# Patient Record
Sex: Female | Born: 1935 | Race: White | Hispanic: No | Marital: Married | Smoking: Never smoker
Health system: Southern US, Community
[De-identification: ages and names within clinical notes are randomized; demographics above are authoritative.]

## PROBLEM LIST (undated history)

## (undated) DIAGNOSIS — R112 Nausea with vomiting, unspecified: Secondary | ICD-10-CM

## (undated) DIAGNOSIS — I10 Essential (primary) hypertension: Secondary | ICD-10-CM

## (undated) DIAGNOSIS — Z9889 Other specified postprocedural states: Secondary | ICD-10-CM

## (undated) DIAGNOSIS — I350 Nonrheumatic aortic (valve) stenosis: Secondary | ICD-10-CM

## (undated) DIAGNOSIS — M1990 Unspecified osteoarthritis, unspecified site: Secondary | ICD-10-CM

## (undated) HISTORY — PX: COLONOSCOPY: SHX174

## (undated) HISTORY — PX: HAND SURGERY: SHX662

## (undated) HISTORY — PX: CARDIAC VALVE REPLACEMENT: SHX585

---

## 1999-03-14 ENCOUNTER — Encounter: Admission: RE | Admit: 1999-03-14 | Discharge: 1999-03-14 | Payer: Self-pay | Admitting: Nephrology

## 1999-03-14 ENCOUNTER — Encounter: Payer: Self-pay | Admitting: Nephrology

## 2000-03-16 ENCOUNTER — Encounter: Admission: RE | Admit: 2000-03-16 | Discharge: 2000-03-16 | Payer: Self-pay | Admitting: Nephrology

## 2000-03-16 ENCOUNTER — Encounter: Payer: Self-pay | Admitting: Nephrology

## 2001-03-17 ENCOUNTER — Encounter: Admission: RE | Admit: 2001-03-17 | Discharge: 2001-03-17 | Payer: Self-pay | Admitting: Nephrology

## 2001-03-17 ENCOUNTER — Encounter: Payer: Self-pay | Admitting: Nephrology

## 2002-02-17 DIAGNOSIS — I350 Nonrheumatic aortic (valve) stenosis: Secondary | ICD-10-CM

## 2002-02-17 HISTORY — DX: Nonrheumatic aortic (valve) stenosis: I35.0

## 2002-02-17 HISTORY — PX: CARDIAC CATHETERIZATION: SHX172

## 2002-03-18 ENCOUNTER — Encounter: Payer: Self-pay | Admitting: Nephrology

## 2002-03-18 ENCOUNTER — Encounter: Admission: RE | Admit: 2002-03-18 | Discharge: 2002-03-18 | Payer: Self-pay | Admitting: Nephrology

## 2002-07-18 ENCOUNTER — Encounter: Admission: RE | Admit: 2002-07-18 | Discharge: 2002-07-18 | Payer: Self-pay | Admitting: Nephrology

## 2002-07-18 ENCOUNTER — Encounter: Payer: Self-pay | Admitting: Nephrology

## 2002-08-24 ENCOUNTER — Encounter (INDEPENDENT_AMBULATORY_CARE_PROVIDER_SITE_OTHER): Payer: Self-pay | Admitting: Cardiology

## 2002-08-24 ENCOUNTER — Ambulatory Visit: Admission: RE | Admit: 2002-08-24 | Discharge: 2002-08-24 | Payer: Self-pay | Admitting: Cardiology

## 2002-08-30 ENCOUNTER — Encounter: Payer: Self-pay | Admitting: Cardiology

## 2002-08-30 ENCOUNTER — Inpatient Hospital Stay (HOSPITAL_COMMUNITY): Admission: RE | Admit: 2002-08-30 | Discharge: 2002-09-09 | Payer: Self-pay | Admitting: Cardiology

## 2002-09-02 ENCOUNTER — Encounter: Payer: Self-pay | Admitting: Thoracic Surgery (Cardiothoracic Vascular Surgery)

## 2002-09-02 ENCOUNTER — Encounter (INDEPENDENT_AMBULATORY_CARE_PROVIDER_SITE_OTHER): Payer: Self-pay

## 2002-09-03 ENCOUNTER — Encounter: Payer: Self-pay | Admitting: Thoracic Surgery (Cardiothoracic Vascular Surgery)

## 2002-09-04 ENCOUNTER — Encounter: Payer: Self-pay | Admitting: Thoracic Surgery (Cardiothoracic Vascular Surgery)

## 2002-09-06 ENCOUNTER — Encounter: Payer: Self-pay | Admitting: Thoracic Surgery (Cardiothoracic Vascular Surgery)

## 2002-09-26 ENCOUNTER — Encounter: Payer: Self-pay | Admitting: Thoracic Surgery (Cardiothoracic Vascular Surgery)

## 2002-09-26 ENCOUNTER — Encounter
Admission: RE | Admit: 2002-09-26 | Discharge: 2002-09-26 | Payer: Self-pay | Admitting: Thoracic Surgery (Cardiothoracic Vascular Surgery)

## 2002-11-07 ENCOUNTER — Encounter (HOSPITAL_COMMUNITY): Admission: RE | Admit: 2002-11-07 | Discharge: 2003-02-05 | Payer: Self-pay | Admitting: Cardiology

## 2003-04-20 ENCOUNTER — Encounter: Admission: RE | Admit: 2003-04-20 | Discharge: 2003-04-20 | Payer: Self-pay | Admitting: Nephrology

## 2003-05-05 ENCOUNTER — Emergency Department (HOSPITAL_COMMUNITY): Admission: EM | Admit: 2003-05-05 | Discharge: 2003-05-05 | Payer: Self-pay

## 2003-11-17 ENCOUNTER — Encounter (HOSPITAL_COMMUNITY): Admission: RE | Admit: 2003-11-17 | Discharge: 2003-11-21 | Payer: Self-pay | Admitting: Nephrology

## 2004-04-24 ENCOUNTER — Encounter: Admission: RE | Admit: 2004-04-24 | Discharge: 2004-04-24 | Payer: Self-pay | Admitting: Nephrology

## 2004-05-28 ENCOUNTER — Encounter: Admission: RE | Admit: 2004-05-28 | Discharge: 2004-05-28 | Payer: Self-pay | Admitting: Nephrology

## 2004-09-26 ENCOUNTER — Encounter: Admission: RE | Admit: 2004-09-26 | Discharge: 2004-09-26 | Payer: Self-pay | Admitting: Nephrology

## 2004-09-27 ENCOUNTER — Ambulatory Visit (HOSPITAL_COMMUNITY): Admission: RE | Admit: 2004-09-27 | Discharge: 2004-09-27 | Payer: Self-pay | Admitting: Nephrology

## 2004-10-22 ENCOUNTER — Encounter: Admission: RE | Admit: 2004-10-22 | Discharge: 2004-10-22 | Payer: Self-pay | Admitting: Nephrology

## 2004-12-27 ENCOUNTER — Emergency Department: Payer: Self-pay | Admitting: Emergency Medicine

## 2005-01-03 ENCOUNTER — Ambulatory Visit (HOSPITAL_COMMUNITY): Admission: RE | Admit: 2005-01-03 | Discharge: 2005-01-03 | Payer: Self-pay | Admitting: Cardiology

## 2005-01-29 ENCOUNTER — Encounter: Admission: RE | Admit: 2005-01-29 | Discharge: 2005-01-29 | Payer: Self-pay | Admitting: Orthopedic Surgery

## 2005-03-21 ENCOUNTER — Encounter: Admission: RE | Admit: 2005-03-21 | Discharge: 2005-03-21 | Payer: Self-pay | Admitting: Orthopedic Surgery

## 2005-05-13 ENCOUNTER — Encounter: Admission: RE | Admit: 2005-05-13 | Discharge: 2005-05-13 | Payer: Self-pay | Admitting: Nephrology

## 2005-10-10 ENCOUNTER — Other Ambulatory Visit: Admission: RE | Admit: 2005-10-10 | Discharge: 2005-10-10 | Payer: Self-pay | Admitting: Nephrology

## 2005-10-10 ENCOUNTER — Encounter: Admission: RE | Admit: 2005-10-10 | Discharge: 2005-10-10 | Payer: Self-pay | Admitting: Nephrology

## 2005-10-30 ENCOUNTER — Encounter: Admission: RE | Admit: 2005-10-30 | Discharge: 2005-10-30 | Payer: Self-pay | Admitting: Nephrology

## 2006-01-16 ENCOUNTER — Encounter: Admission: RE | Admit: 2006-01-16 | Discharge: 2006-01-16 | Payer: Self-pay | Admitting: Orthopedic Surgery

## 2006-04-21 ENCOUNTER — Encounter: Admission: RE | Admit: 2006-04-21 | Discharge: 2006-04-21 | Payer: Self-pay | Admitting: Orthopedic Surgery

## 2006-05-19 ENCOUNTER — Encounter: Admission: RE | Admit: 2006-05-19 | Discharge: 2006-05-19 | Payer: Self-pay | Admitting: Nephrology

## 2006-06-27 ENCOUNTER — Emergency Department: Payer: Self-pay | Admitting: Unknown Physician Specialty

## 2006-09-26 ENCOUNTER — Inpatient Hospital Stay (HOSPITAL_COMMUNITY): Admission: RE | Admit: 2006-09-26 | Discharge: 2006-09-27 | Payer: Self-pay | Admitting: Orthopedic Surgery

## 2006-11-05 ENCOUNTER — Encounter: Admission: RE | Admit: 2006-11-05 | Discharge: 2006-11-05 | Payer: Self-pay | Admitting: Orthopedic Surgery

## 2006-11-26 ENCOUNTER — Encounter: Admission: RE | Admit: 2006-11-26 | Discharge: 2007-02-24 | Payer: Self-pay | Admitting: Orthopedic Surgery

## 2007-01-26 ENCOUNTER — Encounter: Admission: RE | Admit: 2007-01-26 | Discharge: 2007-01-26 | Payer: Self-pay | Admitting: Nephrology

## 2007-03-08 ENCOUNTER — Inpatient Hospital Stay (HOSPITAL_COMMUNITY): Admission: AD | Admit: 2007-03-08 | Discharge: 2007-03-18 | Payer: Self-pay | Admitting: Gastroenterology

## 2007-03-11 ENCOUNTER — Encounter (INDEPENDENT_AMBULATORY_CARE_PROVIDER_SITE_OTHER): Payer: Self-pay | Admitting: Gastroenterology

## 2007-03-22 ENCOUNTER — Ambulatory Visit: Payer: Self-pay | Admitting: Gastroenterology

## 2007-04-27 ENCOUNTER — Encounter: Admission: RE | Admit: 2007-04-27 | Discharge: 2007-04-27 | Payer: Self-pay | Admitting: Gastroenterology

## 2007-05-20 ENCOUNTER — Encounter: Admission: RE | Admit: 2007-05-20 | Discharge: 2007-05-20 | Payer: Self-pay | Admitting: Nephrology

## 2007-06-07 ENCOUNTER — Encounter: Admission: RE | Admit: 2007-06-07 | Discharge: 2007-06-07 | Payer: Self-pay | Admitting: Cardiology

## 2007-11-03 ENCOUNTER — Ambulatory Visit (HOSPITAL_COMMUNITY): Admission: RE | Admit: 2007-11-03 | Discharge: 2007-11-03 | Payer: Self-pay | Admitting: Cardiology

## 2008-03-06 ENCOUNTER — Emergency Department (HOSPITAL_COMMUNITY): Admission: EM | Admit: 2008-03-06 | Discharge: 2008-03-06 | Payer: Self-pay | Admitting: *Deleted

## 2008-05-31 ENCOUNTER — Encounter: Admission: RE | Admit: 2008-05-31 | Discharge: 2008-05-31 | Payer: Self-pay | Admitting: Nephrology

## 2009-03-29 IMAGING — CR DG CHEST 2V
2 series · 2 of 2 positions shown · non-contrast
Comparison: 09/26/2002.

CLINICAL DATA: Arthritis right, preop. Hypertension.

CHEST - 2 VIEW:

[view not recorded (1 of 2)]
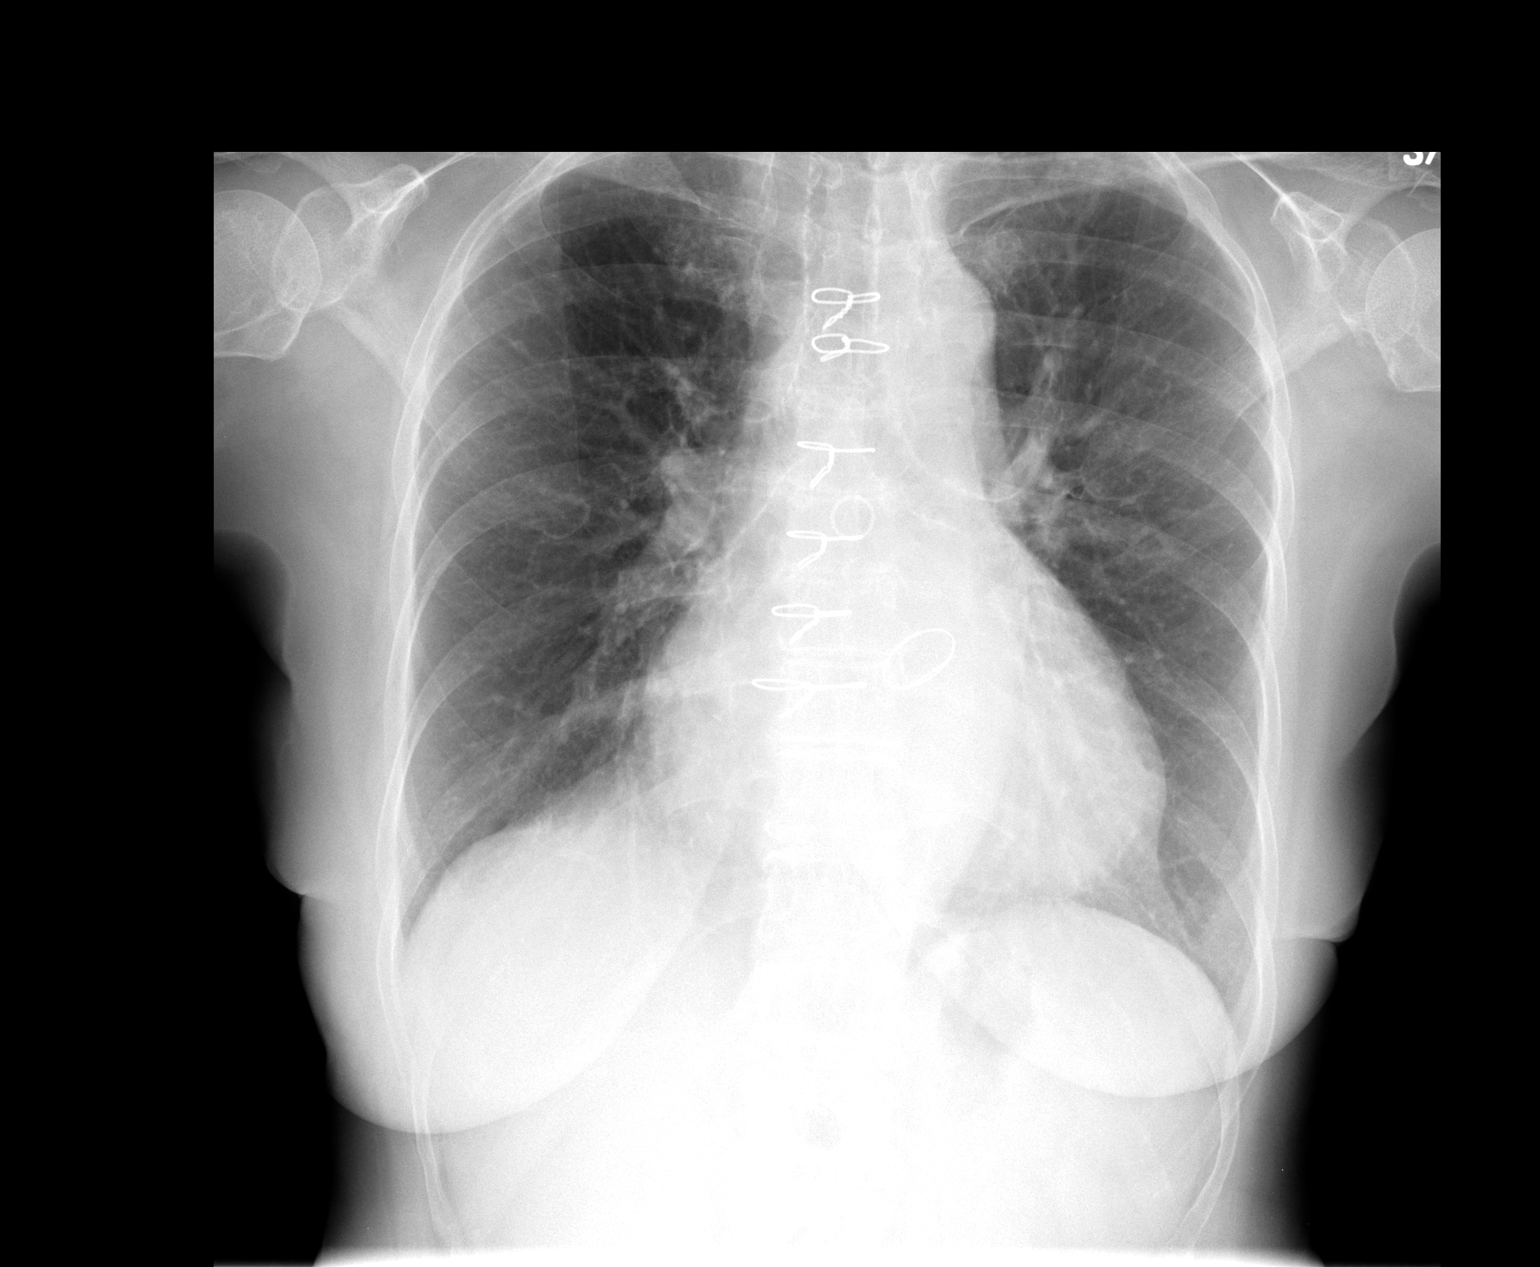

[view not recorded (2 of 2)]
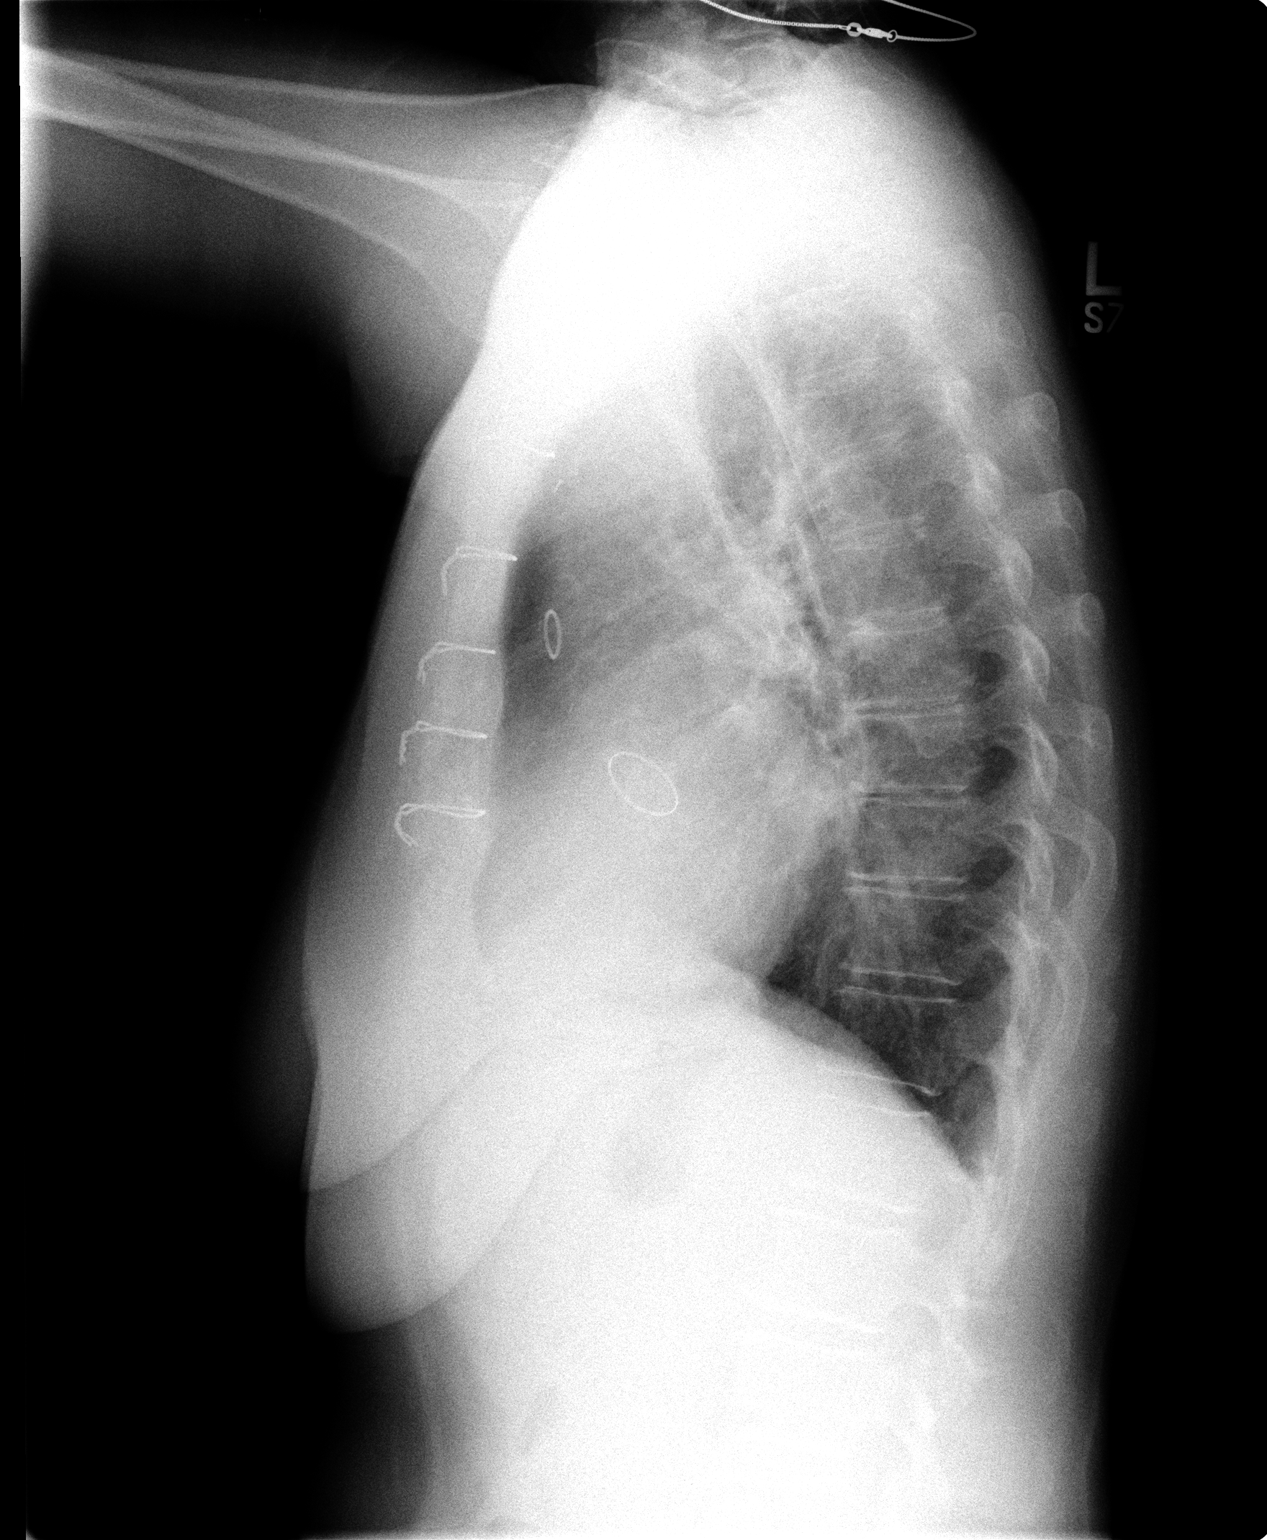

[2 of 2 positions shown; findings below may reference images not displayed]

FINDINGS: Patient status post CABG and aortic valve replacement. There is
cardiomegaly. No focal air space opacities or effusions.
IMPRESSION: Cardiomegaly. No acute findings.

## 2009-06-14 ENCOUNTER — Encounter: Admission: RE | Admit: 2009-06-14 | Discharge: 2009-06-14 | Payer: Self-pay | Admitting: Nephrology

## 2009-09-17 IMAGING — CR DG ABDOMEN 2V
2 series · 2 of 2 positions shown · non-contrast
Comparison: none

CLINICAL DATA: 71-year-old female, status post colonoscopy on 03/11/07.  The patient has had nausea and vomiting and blood in stool.  Rule out ileus.
 ABDOMEN ? 2 VIEW:

[w abdomen upright]
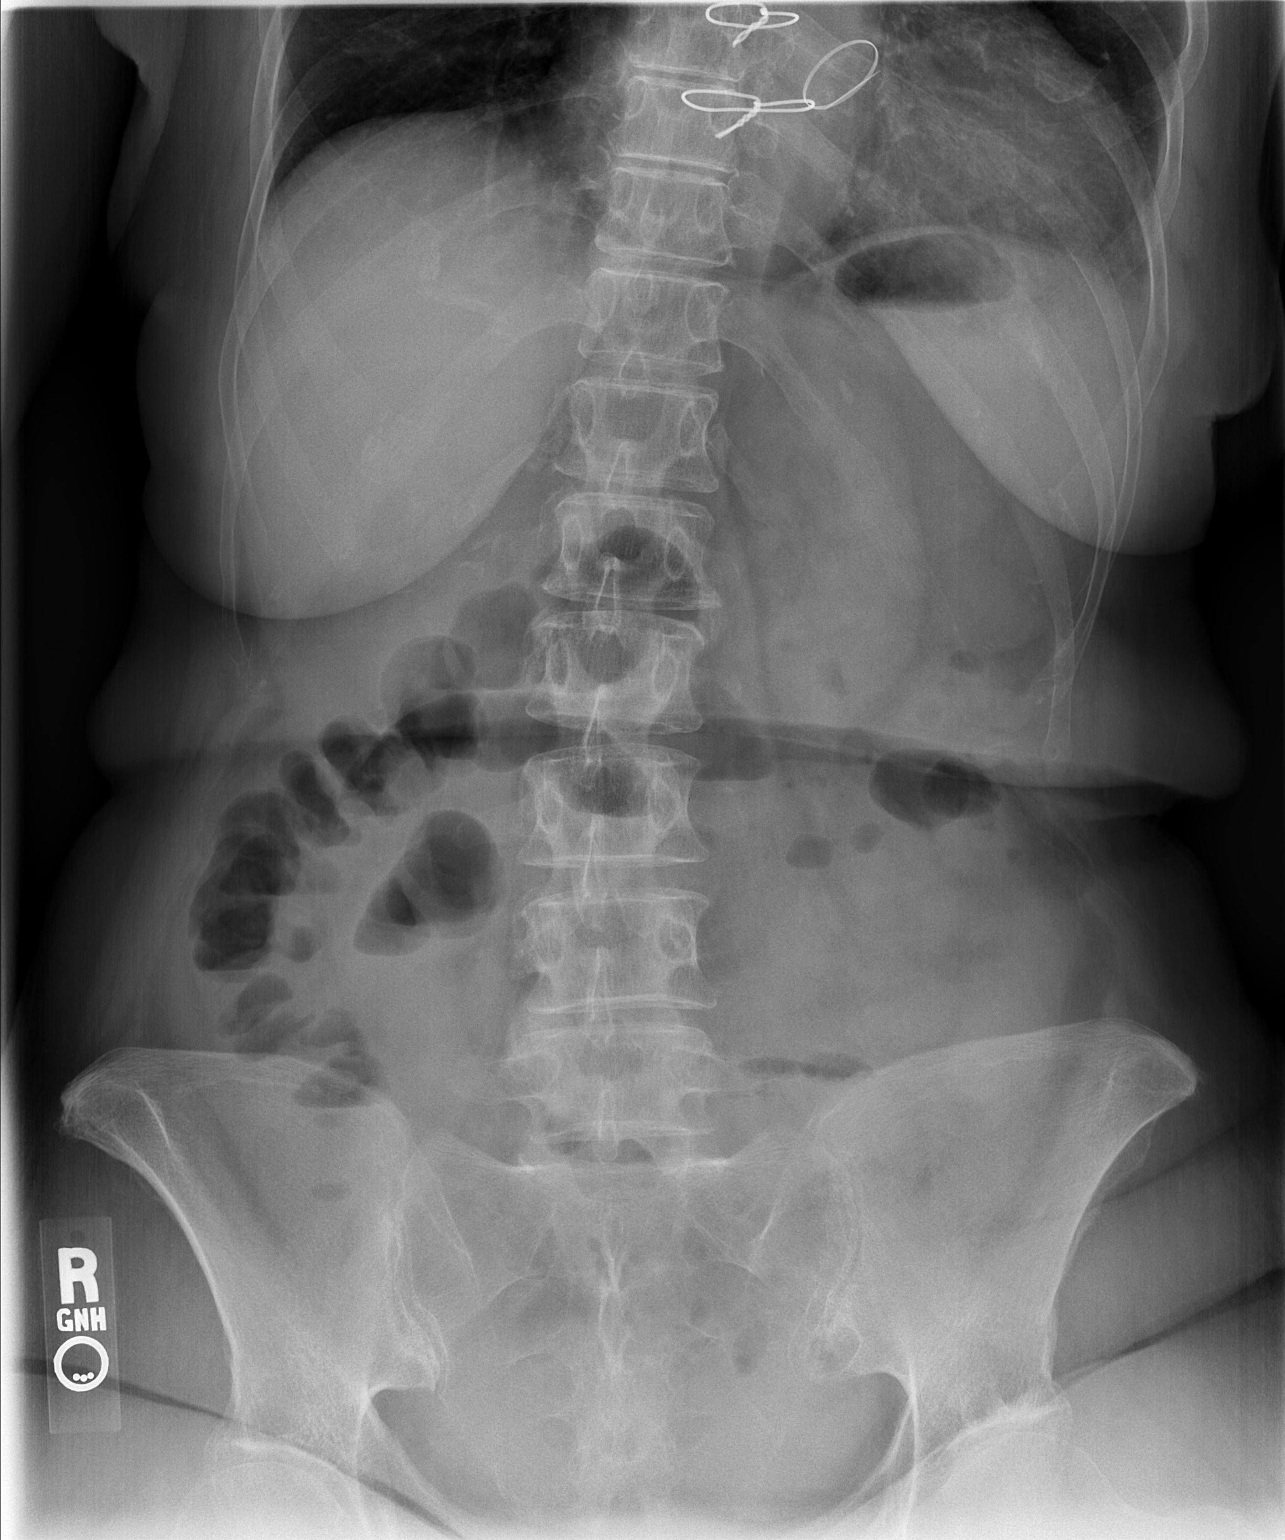

[t abdomen supine]
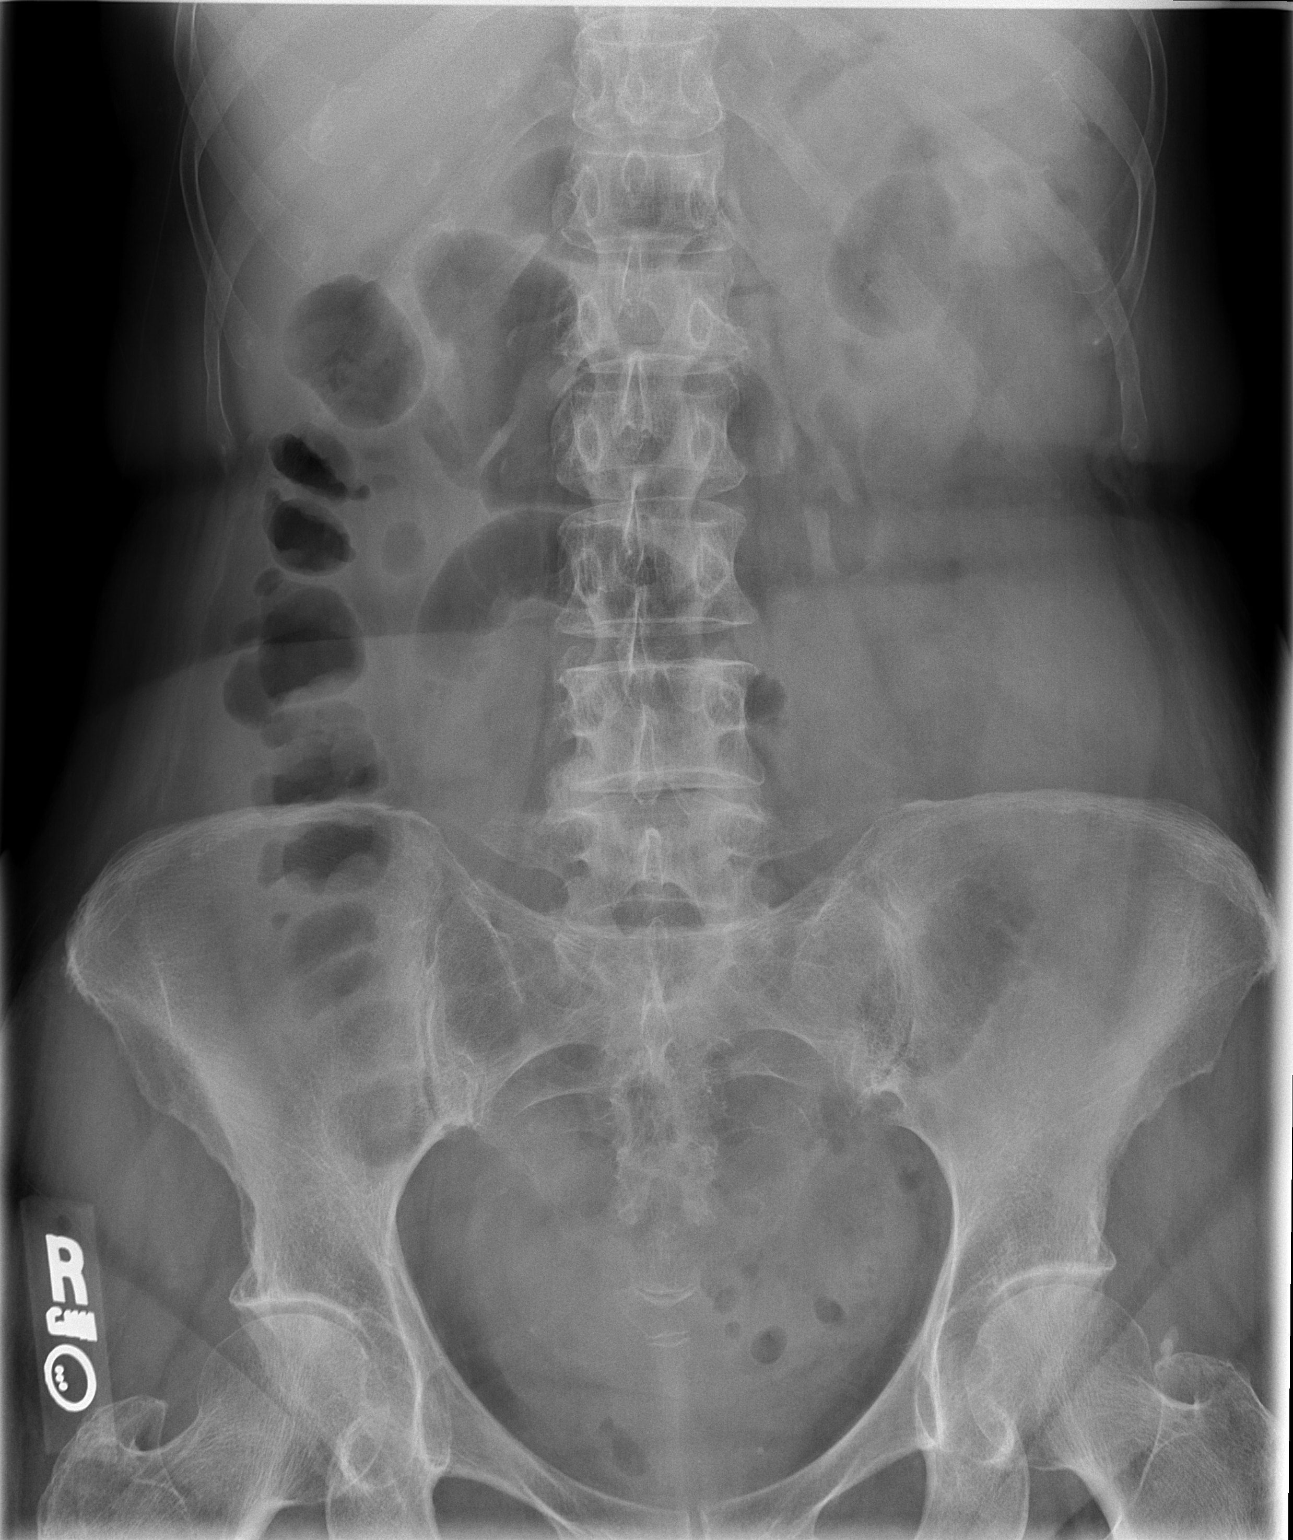

[2 of 2 positions shown; findings below may reference images not displayed]

FINDINGS: Air is seen within the ascending proximal transverse colon.  Air fluid levels are present within nondilated loops of small bowel compatible with ileus.  There is no evidence for free air.
IMPRESSION: 1. Nondilated loops of small bowel demonstrate air fluid levels compatible with an ileus.
 2. Air can be seen to at least the mid transverse colon.

## 2009-10-02 ENCOUNTER — Encounter: Admission: RE | Admit: 2009-10-02 | Discharge: 2009-10-02 | Payer: Self-pay | Admitting: Nephrology

## 2009-10-10 ENCOUNTER — Encounter: Admission: RE | Admit: 2009-10-10 | Discharge: 2009-10-10 | Payer: Self-pay | Admitting: Orthopedic Surgery

## 2010-03-11 ENCOUNTER — Emergency Department (HOSPITAL_COMMUNITY)
Admission: EM | Admit: 2010-03-11 | Discharge: 2010-03-11 | Payer: Self-pay | Source: Home / Self Care | Admitting: Emergency Medicine

## 2010-03-12 LAB — PROTIME-INR
INR: 3.05 — ABNORMAL HIGH (ref 0.00–1.49)
Prothrombin Time: 31.6 s — ABNORMAL HIGH (ref 11.6–15.2)

## 2010-05-07 ENCOUNTER — Other Ambulatory Visit: Payer: Self-pay | Admitting: Nephrology

## 2010-05-07 DIAGNOSIS — Z1231 Encounter for screening mammogram for malignant neoplasm of breast: Secondary | ICD-10-CM

## 2010-06-17 ENCOUNTER — Ambulatory Visit
Admission: RE | Admit: 2010-06-17 | Discharge: 2010-06-17 | Disposition: A | Discharge status: Home / Self Care | Payer: Medicare Other | Source: Ambulatory Visit | Attending: Nephrology | Admitting: Nephrology

## 2010-06-17 DIAGNOSIS — Z1231 Encounter for screening mammogram for malignant neoplasm of breast: Secondary | ICD-10-CM

## 2010-07-02 NOTE — H&P (Signed)
NAME:  Monica Wolf, Monica Wolf           ACCOUNT NO.:  [id number]   MEDICAL RECORD NO.:  [id number]          PATIENT TYPE:  INP   LOCATION:  3033                         FACILITY:  [facility]   PHYSICIAN:  Jordan Hawks. Elnoria Howard, MD    DATE OF BIRTH:  February 04, 1936   DATE OF ADMISSION:  03/08/2007  DATE OF DISCHARGE:                              HISTORY & PHYSICAL   PRIMARY CARE Dontrez Pettis:  Jarome Matin, M.D.   HISTORY OF PRESENT ILLNESS:  This is a 75 year old female who was  admitted to the hospital for heparin/Coumadin crossover in light of her  aortic valve.  The patient was given the option to use subcutaneous  heparin at home, however, she felt uncomfortable dispatching this  medication to herself and was subsequently was admitted in order to be  on the heparin/Coumadin crossover.  She currently denies any issues with  her colon at this time in regards to diarrhea, constipation,  hematochezia or melena.   PAST SURGICAL HISTORY:  Aortic valve replacement with chronic Coumadin.   PAST MEDICAL HISTORY:  1. Hematuria.  2. Arthritis.  3. Scarlet fever.  4. Hyperlipidemia.  5. Hypertension.   ALLERGIES:  NO KNOWN DRUG ALLERGIES.   MEDICATIONS:  Aspirin, Zocor, Zestril, Requip, Coreg and Coumadin.   FAMILY HISTORY:  Significant for her father dying at the age of 88  secondary to lung cancer.  Mother dying at age of 67 with possible heart  disease.  The patient is married without any children.  No alcohol,  tobacco or illicit drug use.   REVIEW OF SYSTEMS:  Negative per the 11-point review of systems.   PHYSICAL EXAMINATION:  VITAL SIGNS:  Blood pressure is 163/79, heart  rate 55, respirations 20, temperature is 97.5.  GENERAL:  The patient is in no acute distress, alert and oriented.  HEENT:  Normocephalic, atraumatic.  Extraocular muscles intact.  NECK:  Supple.  No lymphadenopathy.  LUNGS:  Clear to auscultation bilaterally.  CARDIOVASCULAR:  Regular rate and rhythm with a 2/6  systolic ejection  murmur.  ABDOMEN:  Flat, soft, nontender, nondistended.  EXTREMITIES:  No clubbing, cyanosis or edema.   LABORATORY DATA AND X-RAY FINDINGS:  Her INR is at 2.8 with a PT of  30.6.   IMPRESSION/PLAN:  1. Aortic valve.  2. Screening colonoscopy.  3. Anorexia.  After evaluating the patient, she requires      heparin/Coumadin crossover.  Her current INR is at 2.8 and I      discontinued her Coumadin for the time being and started her on      Lovenox at 1 mg/kg q.12 h.  Once her INR approaches that of 1.5, I      will ask pharmacy to consult in regards for intravenous heparin.      Pending the results of her INR, she will be prepped for a procedure      and subsequently, she may be able to be discharged if she feels      more comfortable with providing herself with subcutaneous      injections.      Jordan Hawks Elnoria Howard, MD  Electronically  Signed     PDH/MEDQ  D:  03/08/2007  T:  03/09/2007  Job:  [id number]   cc:   Jarome Matin, M.D.  Eduardo Osier. Sharyn Lull, M.D.

## 2010-07-02 NOTE — Op Note (Signed)
NAME:  Monica, Wolf           ACCOUNT NO.:  [id number]   MEDICAL RECORD NO.:  [id number]          PATIENT TYPE:  OIB   LOCATION:  1608                         FACILITY:  [facility]   PHYSICIAN:  Myrtie Neither, MD      DATE OF BIRTH:  08/24/1935   DATE OF PROCEDURE:  DATE OF DISCHARGE:                               OPERATIVE REPORT   PREOPERATIVE DIAGNOSIS:  Degenerative arthropathy, right carpometacarpal  thumb.   POSTOPERATIVE DIAGNOSIS:  Degenerative arthropathy, right  carpometacarpal thumb.   ANESTHESIA:  General.   PROCEDURE:  Fusion carpal metacarpal joint of the right thumb.   DESCRIPTION OF PROCEDURE:  The patient taken to the operating room after  given adequate preop medications given general anesthesia intubated,  right arm was prepped with DuraPrep and draped in sterile manner.  Tourniquet and Bovie used for hemostasis.  Mini C-arm was used visualize  fusion placement.  Dorsal incision made over the right thumb at the  carpal metacarpal joint going through skin and subcutaneous tissue.  Sharp blunt dissection made down to the carpometacarpal joint.  Osteophyte over the base of the first metacarpal was resected.  Capsule  was resected. Articular surface was exposed of the joint.  With a dental  oscillating saw articular surface was removed from the proximal end of  the first metacarpal and over articular surface of the carpal bone.  Thick fibrotic arthritic synovial lining was also resected from the  area.  The thumb was placed into full flexion position function and  stabilized with a K-wire. This was visualized with the mini C-arm and  was found to be in good position, good contact.  A mini plate was used  with three screws, two in the carpal and one in metacarpal to stabilize  the fusion site.  Copious irrigation then done.  Wound closure was then  done with 2-0 Vicryl for the subcutaneous and skin was closed with 4-0  nylon.  Thumb spica cast was applied.  The  patient tolerated procedure  quite well and went to recovery room in stable and satisfactory  condition.      Myrtie Neither, MD  Electronically Signed     AC/MEDQ  D:  09/26/2006  T:  09/28/2006  Job:  [id number]

## 2010-07-02 NOTE — Discharge Summary (Signed)
NAME:  Monica Wolf, Monica Wolf           ACCOUNT NO.:  [id number]   MEDICAL RECORD NO.:  [id number]          PATIENT TYPE:  INP   LOCATION:  3033                         FACILITY:  [facility]   PHYSICIAN:  Jordan Hawks. Elnoria Howard, MD    DATE OF BIRTH:  [birth year]   DATE OF ADMISSION:  03/08/2007  DATE OF DISCHARGE:  03/18/2007                               DISCHARGE SUMMARY   ADMISSION DIAGNOSES:  1. Postpolypectomy bleed.  2. Ileus.   DISCHARGE DIAGNOSES:  1. Postpolypectomy bleed.  2. Ileus.   CARDIOLOGISTEduardo Osier Sharyn Lull, MD   HISTORY AND PHYSICAL:  Please see the original H&P for full details.   HOSPITAL COURSE:  The patient was admitted to the hospital and underwent  heparin/Coumadin crossover.  Once the patient's INR was 1.5, she was  taken to the endoscopy suite for a colonoscopy.  During the examination,  she was noted to have prepped well.  However, in the sigmoid colon, a  sessile polyp was identified, and this was removed with a snare  polypectomy, and subsequently, she was returned to her hospital bed for  resumption of anticoagulation.  Unfortunately, with restarting her  anticoagulation, which was for her aortic valve, she started to develop  hematochezia.  Because of the cardiac valve, anticoagulation was  required, and she did receive 2 units of packed red blood cells.  The  patient did stop bleeding for a couple of days, and then subsequently,  she started to rebleed again with increase of her heparin drip.  Subsequently, she was taken back to the endoscopy unit, and the  postpolypectomy site was identified under flexible sigmoidoscopy, and  hemoclips were placed on that site.  At that point, the patient did not  have any further bleeding.  She did have a transient episode of ileus;  however, this was approximately 7 days after the procedure and which  appeared to have resolved.  She was able to tolerate p.o. and resume her  Coumadin dosing.  At the time of discharge, her INR  was approaching  within acceptable range, and discussion with Dr. Sharyn Lull, it was felt  that it was safe for her to be discharged home and to have monitoring as  an outpatient upon discharge.   FOLLOWUP:  The patient is to follow up with Dr. Elnoria Howard for repeat checkup  of her hemoglobin.      Jordan Hawks Elnoria Howard, MD  Electronically Signed     PDH/MEDQ  D:  05/14/2007  T:  05/15/2007  Job:  [id number]   cc:   Eduardo Osier. Sharyn Lull, M.D.

## 2010-07-05 ENCOUNTER — Other Ambulatory Visit: Payer: Self-pay | Admitting: Family Medicine

## 2010-07-05 ENCOUNTER — Ambulatory Visit
Admission: RE | Admit: 2010-07-05 | Discharge: 2010-07-05 | Disposition: A | Discharge status: Home / Self Care | Payer: Medicare Other | Source: Ambulatory Visit | Attending: Family Medicine | Admitting: Family Medicine

## 2010-07-05 DIAGNOSIS — R05 Cough: Secondary | ICD-10-CM

## 2010-07-05 NOTE — Cardiovascular Report (Signed)
NAME:  Monica Wolf, Monica Wolf                     ACCOUNT NO.:  [id number]   MEDICAL RECORD NO.:  [id number]                   PATIENT TYPE:  OIB   LOCATION:  2011                                 FACILITY:  [facility]   PHYSICIAN:  Eduardo Osier. Sharyn Lull, M.D.              DATE OF BIRTH:  February 07, 1936   DATE OF PROCEDURE:  08/30/2002  DATE OF DISCHARGE:                              CARDIAC CATHETERIZATION   PROCEDURES:  Left and right cardiac catheterization with selective left and  right coronary angiography, insertion of Swan-Ganz catheter for measurement  of cardiac outputs via right groin using Judkins technique.   INDICATION FOR PROCEDURE:  Ms. Riecke is a 75 year old white female with  past medical history significant for syncope, hypercholesterolemia, history  of heart murmur, complaints of retrosternal chest tightness grade 3/10  localized, associated with shortness of breath with minimal exertion,  relieved with rest, in a few minutes off and on for the last two months.  The patient denies palpitations but states she felt dizzy, felt lightheaded,  which lasted for a few minutes a few weeks ago.  Denies any recent syncopal  episode.  States had syncopal episode as a child.  Denies any nausea,  vomiting, diaphoresis.  Denies PND, orthopnea, leg swelling.  The patient  had EKG done in my office, which showed LVH with T-wave inversion in lateral  leads.   PAST MEDICAL HISTORY:  As above.   PAST SURGICAL HISTORY:  None.   ALLERGIES:  No known drug allergies.   MEDICATIONS:  1. Lipitor 20 mg p.o. daily.  2. Baby aspirin 81 mg p.o. daily.   SOCIAL HISTORY:  She is married, has no children.  Retired from [employer].  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  No  history of smoking or alcohol abuse.  She was born in [place] and lives in  [place].   FAMILY HISTORY:  Mother died of valvular heart disease, and she was  hypertensive, also had stroke at the age of 74.   Father died of CA of lung  at the age of 24.  He also had tobacco abuse.  One brother and one sister,  in good health.   PHYSICAL EXAMINATION:  GENERAL:  On examination, she was alert, awake,  oriented x3, in no acute distress.  VITAL SIGNS:  Blood pressure was 130/84, pulse was 70 and regular.  HEENT:  Conjunctivae were pink.  NECK:  Supple, no JVD.  Carotid upstroke was diminished.  CHEST:  Lungs were clear to auscultation without rhonchi or rales.  CARDIAC:  S1, S2 was normal.  There was a 3/6 systolic ejection murmur of  aortic stenosis.  ABDOMEN:  Soft, bowel sounds were present, nontender.  EXTREMITIES:  There was no clubbing, cyanosis, or edema.   The patient had 2 D echo done as outpatient, which showed critical aortic  stenosis with aortic valve area of 0.49 sq. cm with mean  gradient across the  aortic valve of 86 mmHg.  Due to typical anginal chest pain, critical aortic  stenosis, the patient was advised to have left and right cardiac  catheterization and possible aortic valve replacement.  The patient was  discussed at length regarding the above procedure, its risks, i.e., the  __________ stroke, need for emergency CABG, local vascular complications,  and aortic valve replacement, and consented for the procedure.   After obtaining written informed consent, the patient was brought to the  catheterization lab and was placed on fluoroscopic table.  Right groin was  prepped and draped in the usual fashion.  Xylocaine 2% was used for local  anesthesia in the right groin.  With the help of a thin-walled needle, 6  French arterial and 8 French venous sheaths were placed.  Both the sheaths  were aspirated and flushed.  Next a 7.5 French thermodilution Swan-Ganz  catheter was advanced under fluoroscopic guidance up to the RA, RV, PA, and  into wedge position.  Pressures are recorded in above locations.  Next  cardiac outputs were done.  Next 6 French left Judkins catheter was  advanced  over the wire under fluoroscopic guidance up to the ascending aorta.  The  wire was pulled out, the catheter was aspirated and connected to the  manifold.  The catheter was further advanced under vision to the left  coronary ostium.  Multiple views of the left system.  Next the catheter was  disengaged and was pulled out over the wire and was replaced with a 6 French  right Judkins catheter, which was advanced over the wire under fluoroscopic  guidance up to the ascending aorta.  The wire was pulled out.  The catheter  was aspirated and connected to the manifold.  The catheter was further  advanced under vision to right coronary ostium.  Multiple views of the right  system were taken.  Next the catheter was disengaged and was pulled out over  the wire and was replaced with 6 French pigtail catheter, which was advanced  over the wire under fluoroscopic guidance up to the ascending aorta.  The  wire was pulled out, the catheter was aspirated and connected to the  manifold.  The catheter was attempted to advance the catheter across the  aortic valve without success.  Then aortography was done in 60 degree LAO  position.  Next the pigtail catheter was pulled out over the wire, sheaths  were aspirated and flushed.   FINDINGS:  1. LV was not done as unable to cross the critically stenosed aortic valve.  2. Aortography showed ascending aorta to be mildly dilated.  3. Left main was short, which was patent.  4. LAD has 15-20% ostial and proximal stenosis.  Distally the vessel is very     tortuous and diffusely diseased at the apex.  Diagonal 1 is large with a     20-25% ostial stenosis.  Diagonal 2 is small, which is tortuous and     patent.  5. Left circumflex is large, which is patent.  OM-1 and OM-2 are very small.     OM-3 is medium-sized, which is patent.  6. RCA has 10-15% mid-stenosis.   SWAN-GANZ CATHETER READINGS:  1. RA pressures were 5.  2. RV 38/2. 3. PA 30/8.  4.  Pulmonary capillary wedge pressure was 14.  5. Cardiac output was 5.1 L/min.   Aortic valve area by 2 D echo was 0.49 cm with mean gradient across the  aortic valve of 86 mmHg, with preserved LV systolic function confirmed by  echo.   The patient tolerated the procedure well.  There were no complications.  The  patient was discharged to the recovery room in stable condition.  Will get a  CVTS consult for possible AVR.                                               Eduardo Osier. Sharyn Lull, M.D.    MNH/MEDQ  D:  08/30/2002  T:  08/31/2002  Job:  [id number]  [facility]   Jarome Matin, M.D.  [address] [place]  [place] [address]  Fax: [phone]   Coletta Memos, M.D.  [address]  [place]  [place] [address]  Fax: [phone]   CVTS Office   cc:   [facility]   Jarome Matin, M.D.  [address] [place]  [place] [address]  Fax: [phone]   Coletta Memos, M.D.  [address]  [place]  [place] [address]  Fax: [phone]   CVTS Office

## 2010-07-05 NOTE — Consult Note (Signed)
NAME:  Monica Wolf, Monica Wolf                     ACCOUNT NO.:  [id number]   MEDICAL RECORD NO.:  [id number]                   PATIENT TYPE:  OIB   LOCATION:  2011                                 FACILITY:  [facility]   PHYSICIAN:  Salvatore Decent. Dorris Fetch, M.D.         DATE OF BIRTH:  07-15-1935   DATE OF CONSULTATION:  08/30/2002  DATE OF DISCHARGE:                                   CONSULTATION   REASON FOR CONSULTATION:  Critical aortic stenosis.   HISTORY OF PRESENT ILLNESS:  Monica Wolf is a 75 year old female with  recently diagnosed hypercholesterolemia.  Her past medical history is  significant for syncope and a heart murmur.  She was unclear that she had a  heart murmur prior to this admission.  She states that over the past two  months she has had intermittent exertional shortness of breath which is  associated with some retrosternal chest pressure which is usually mild.  This shortness of breath almost always is with exertion but she has noted  this with less exertion over the past couple of weeks.  She has not had any  palpitations.  She has had some light headedness.  She has a history of  syncope as a child but with very rare episodes of that since she was a  teenager and has not had any over the past two months.  She denies any  peripheral edema, or paroxysmal nocturnal dyspnea.  She has not had any  retrosternal nocturnal symptoms.  She was seen by Monica Wolf in the office  and an EKG showed left ventricular hypertrophy with a strain pattern, and  also was noted to have a mild heart murmur.  She was admitted and had a  transthoracic echocardiogram performed which showed critical aortic stenosis  with a valve area estimated at 0.49 cm squared.  Her mean transvalvular  gradient 86 mmHg which is likely slightly overestimated.  Her EF was normal  with an ejection fraction of 55 to 65%, she did have LVH.  She had mild  mitral regurgitation and no other cardiac abnormalities.  The  patient  underwent cardiac catheterization which revealed no significant coronary  artery disease.   OTHER PAST MEDICAL HISTORY:  1. Hyperlipidemia.  2. Mild arthritis in her hands.  3. She has been very healthy other than as mentioned in the HPI.   PAST SURGICAL HISTORY:  None.   MEDICATIONS:  1. Lipitor 10 mg p.o. daily.  2. Baby aspirin 81 mg p.o. daily.   ALLERGIES:  She has no known drug allergies.   FAMILY HISTORY:  Mother died at age 57 of heart disease.  Father died at age  61 of lung cancer.   SOCIAL HISTORY:  She is married.  She is retired as a Chief Executive Officer, she now works part-time.  She does not smoke or use ethanol.   REVIEW OF SYSTEMS:  GENERAL: As noted in the HPI, she has been  very active  physically.  She has not noted any weight gain or loss.  PULMONARY: She has  not had any problems with wheezing or cough, hemoptysis.  NEUROLOGIC: She  has not had any history of stroke, TIA or seizure.  HEMATOLOGY: No history  of abnormal bleeding or bruising.  All other systems are negative.   PHYSICAL EXAMINATION:  GENERAL: Monica Wolf is a 75 year old white female  in no acute distress. She is well-developed and well-nourished.  VITAL SIGNS: Blood pressure is 120/64, pulse is 78 and regular, respirations  are 12 and non labored.  NEUROLOGIC: She is alert and oriented x3 and grossly intact.  HEENT: Unremarkable.  She has good dentition.  NECK: Has transmitted heart murmur sounds bilaterally.  CARDIAC: Regular rate and rhythm, normal S1 and S2.  There is a 3/6 harsh  crescendo, decrescendo murmur loudest at the right upper sternal border but  heard well throughout the precordium.  LUNGS: Are clear with no rales or wheezing.  ABDOMEN: Soft and nontender.  EXTREMITIES: Without clubbing, cyanosis or edema.  She has 2+ radial pulses,  dorsalis pedis and posterior tibial pulses throughout.  SKIN: Is warm, pink and dry.   LABORATORY DATA:  Echocardiogram  and catheterization as noted.  White count  6.3 thousand, platelets 175,000, hematocrit 41.  PT 12.5, PTT 34, sodium  140, potassium 4.1, glucose 85, BUN and creatinine 16 and 1.1.   IMPRESSION:  Monica Wolf is a 75 year old white female who has been overall  in excellent health.  She now presents with a two-month history of  exertional dyspnea and anginal type symptoms due to critical aortic  stenosis.  Aortic valve replacement is indicated for survival benefit as  well as relief of her symptoms.   I had a long discussion with Monica Wolf as well as her sister and sister-  in-law regarding the indications, risks, benefits and alternatives.  They  understand the medical history of aortic stenosis.  They understand the risk  of valve replacement including but not limited to death, stroke, myocardial  infarction, deep venous thrombosis, pulmonary embolism, heart block  requiring pacemaker placement, bleeding and the possible need for  transfusions, infections, respiratory or renal failure, peptic ulcer disease  as well as other unforeseeable complications.   We also had a discussion regarding the advantages and disadvantages of  mechanical versus tissue valve.  She understands the mechanical valves have  frequent longevity but do require life long anticoagulation.  Longevity  could be a significant issue in her as she is 75 years old with no other  significant medical problems and with aortic valve replacement has an  excellent long term life expectancy.  She also understands the advantages of  tissue valves that do not require life long anticoagulation, we did discuss  bovine heart valves as well as stentless Porcine valves.  The question in  this case would be the potential for valve failure although her age with no  renal insufficiency, she may do quite well and could potentially have that  valvoplasty the rest of her natural life.  Monica Wolf wishes to think over her options and  discuss with her husband  prior to deciding whether to proceed with aortic valve replacement.  Once  she has made that decision we will discuss further valve options and decide  whether to use mechanical or tissue valve.  Salvatore Decent Dorris Fetch, M.D.    SCH/MEDQ  D:  08/30/2002  T:  08/31/2002  Job:  [id number]

## 2010-07-05 NOTE — Op Note (Signed)
NAME:  Monica Wolf, Monica Wolf                     ACCOUNT NO.:  [id number]   MEDICAL RECORD NO.:  [id number]                   PATIENT TYPE:  INP   LOCATION:  2302                                 FACILITY:  [facility]   PHYSICIAN:  Salvatore Decent. Dorris Fetch, M.D.         DATE OF BIRTH:  September 05, 1935   DATE OF PROCEDURE:  09/02/2002  DATE OF DISCHARGE:                                 OPERATIVE REPORT   PREOPERATIVE DIAGNOSIS:  Critical aortic stenosis.   POSTOPERATIVE DIAGNOSIS:  Critical aortic stenosis.   PROCEDURE:  1. Median sternotomy.  2. Extracorporeal circulation.  3. Aortic valve replacement with 19 St. Jude's Regent valve.  4. Coronary artery bypass graft x1 with a saphenous vein graft to the right     coronary.   SURGEON:  Salvatore Decent. Dorris Fetch, M.D.   ASSISTANT:  Rowe Clack, P.A.-C.   ANESTHESIA:  General.   FINDINGS:  1. Marked left ventricular hypertrophy.  2. Congenital bicuspid heavily calcified stenotic valve.  3. Perforation at the base of 1 leaflet.  4. Moderately calcified annulus.  5. Question partial obstruction of right coronary ostia by the valve,     therefore saphenous vein graft was placed to the right coronary.   CLINICAL NOTE:  Monica Wolf is a 75 year old female who presented with a  lifelong history of syncopal spells and recent onset of anginal pain. She  was seen and evaluated and found to have an aortic stenosis murmur.  Echocardiogram showed critical aortic stenosis with an estimated valve area  of 0.40 cm2. She underwent cardiac catheterization which revealed no  significant coronary disease. She was referred for aortic valve replacement.  The indications,  risks, benefits and alternatives and valve options were  discussed  in detail with the patient. She understood and accepted the risks  and agreed to proceed with the surgery with a strong preference for a  mechanical valve, understanding the need for lifelong anticoagulation.   DESCRIPTION  OF PROCEDURE:  Monica Wolf was brought to the preoperative  holding area on September 02, 2002. Lines were placed to monitor arterial,  central venous and pulmonary arterial pressure. Intravenous antibiotics were  administered. She was taken to the operating room, anesthetized and  intubated. A Foley catheter was placed. Transesophageal echocardiography was  performed which revealed critical aortic stenosis and mild mitral  regurgitation. There was post stenotic dilatation but no true aneurysm  of  the ascending aorta. Overall there was marked left ventricular hypertrophy  with good systolic function. The chest, abdomen and legs then were prepped  in the usual sterile fashion.   A median sternotomy was performed. The pericardium was opened. The patient  was heparinized and we confirmed adequate anticoagulation with ACT  measurement. The aorta was cannulated via  concentric 2-0 Ethibond pledgeted  pursestring sutures. A dual stage venous cannula was placed via a  pursestring suture in the right atrial appendage. Cardiopulmonary bypass was  instituted and the patient was cooled  to 28 degrees Celsius.   A left ventricular vent was placed via the right superior pulmonary vein. A  retrograde cardioplegia cannula was placed via the right atrium and directed  into the coronary sinus. Position was confirmed with palpation as well as  coronary sinus wedge pressure tracing with balloon inflation. An antegrade  cardioplegia cannula was placed in the ascending aorta. A foam pad was  placed in the pericardium to protect the phrenic nerve and to insulate the  heart  and a temperature probe was placed in the myocardium.   The aorta was cross clamped. The left ventricle was emptied via the vents.  Cardiac arrest then was achieved with a combination of cold antegrade blood  cardioplegia and topical iced saline. Then 750 mL of cardioplegia was  administered. The myocardial septal temperature was 90 degrees  Celsius.  Additional cardioplegia was administered at 20 minute intervals or if the  myocardial septal temperature rose above 15 degrees Celsius.   An aortotomy was performed. The valve was inspected.  It was a bicuspid  valve with essentially equal size of the 2 leaflets. There was a perforation  behind the leaflet in what would be the noncoronary location. The valve  leaflets were excised. They were heavily calcified. There was moderate  annular calcification. There was no evidence of acute vegetations.   After excising the valve leaflets the aortic annulus was debrided. Care was  taken to remove all loose calcium. The annulus was the copiously irrigated  with iced saline. The annulus was sized for a 19 St. Jude's Regent valve.  The sizer passed easily through the annulus. Then 2-0 Ethibond pledgeted  sutures were placed in a horizontal mattress fashion circumferentially  around the annulus with the pledgets in a subannular position.   The pledgets were then passed through the sewing ring of the valve. The  valve was lowered into place; this was a very tight fit. As the sutures were  tied circumferentially around the sewing ring the leaflets were opened to  ensure the valve was well suited. After tying the final suture the leaflets  were once again opened. The valve was well seated. The valve sewing ring was  inspected circumferentially to ensure that there were no gaps. The coronary  ostia were inspected. A  right-angle clamp was gently passed into the ostia  to ensure patency. The sewing ring of the valve was close to but not  completely obstructing the right coronary ostia. There was concern that  there might be a partial obstruction or kinking of that area. Rewarming was  begun.   The aortotomy then was closed with a 2 layer closure. The 1st layer was a  running horizontal mattress suture with a 4-0 Prolene suture. The 2nd layer was a running simple suture. At the completion of  the suture line and before  tying the suture, the patient was placed in the Trendelenburg position and  extensive deairing maneuvers were performed. The patient was rotated from  side-to-side. The lungs were inflated. The heart was allowed to fill with  blood. After completely deairing the root, the aortic cross clamp was  removed and the sutures were tied. The total cross clamp  time was 86  minutes.   The patient had a fragile aorta and there were multiple  significant  bleeding sites from the suture holes along the aorta which required several  pledgeted sutures to control the bleeding. As the patient was being rewarmed  the heart was allowed  to fill with blood. The lungs were ventilated and the  pump flows were decreased  to inspect for valve function as well as left  ventricular function. There were ST elevations and there was signs of  posterior wall dysfunction. Therefore the heart was emptied and the patient  was placed back on full support.   A segment of saphenous vein was harvested from the right ankle. This was  small but satisfactory. This was then used as a bypass graft  from the  aorta to the distal  right coronary artery. The distal anastomosis was  performed first. The distal right coronary was a 1.5-mm good quality target.  Anastomosis was performed with a running 7-0 Prolene suture  and was probed  both proximally  and distally to ensure patency before tying the suture.  There was excellent flow through the graft. Of note the aorta had been re  cross clamped. The patient had been rearrested with cold blood cardioplegia  prior to doing the distal  anastomosis.   The antegrade cardioplegia cannula was removed from the ascending aorta and  the proximal  anastomosis was performed through a 4.0-mm punch aortotomy  with a running 6-0 Prolene suture. At the completion of this anastomosis the  aortic root was once again deaired. The patient was placed in the  Trendelenburg  position and the aortic cross clamp  was again removed. Total  cross clamp time was 28 minutes. The vein graft stripped easily. There was  good hemostasis at both anastomoses.   After the patient had been rewarmed to 37 degrees Celsius, pacing wires were  placed on the right atrium and right ventricle. The patient was atrially  paced for rate, although she was in sinus bradycardia underneath the pacer.  An attempt was made to wean from cardiopulmonary bypass. The heart was  allowed to fill with blood as the lungs were inflated. When the heart began  to eject, the pump flows were decreased. The patient  initially weaned  easily from bypass but  there was bleeding from the aortic suture line and  right ventricular distention. The total bypass time had been 204 minutes.   The patient was placed back on cardiopulmonary bypass immediately and  rested. A Dopamine drip was  initiated. Bleeding from the aorta was controlled with 4-0 Prolene pledgeted sutures. After resting the heart  briefly, a 2nd attempt was made to wean; the patient weaned easily from  bypass with the 2nd attempt. The 2nd bypass run had been 19 minutes with a  total of 223 minutes.   At this point there was good cardiac function with a  cardiac index of  greater than 2 liters per minute per meter squared. The patient's blood  pressure was labile and she was volume dependent, but transesophageal  echocardiography showed good systolic function concentrically. The valve was  functioning well with good motion of both leaflets. There were no  perivalvular leaks.   A test dose of Protamine was administered and was well tolerated. The atrial  and aortic cannulae were removed. The remainder of the Protamine was  administered without incident. The chest was irrigated with 1 liter of warm  normal saline containing 1 gm of vancomycin. Hemostasis was achieved. The  pericardium was reapproximated with interrupted 3-0 silk sutures. Two   mediastinal chest tubes were placed through separate subcostal incisions.   The sternum was closed with heavy gage interrupted stainless steel wires.  The pectoralis fascia was closed with a running #1  Vicryl suture. The  remainder of the incisions were closed in a standard fashion with  subcuticular skin closures. All sponge, instrument and needle counts were  correct at the end of the procedure. She remained  hemodynamically labile, but with good  cardiac indices. She was taken from  the operating room to the surgical intensive care unit in critical but in  stable condition.                                                 Salvatore Decent Dorris Fetch, M.D.    SCH/MEDQ  D:  09/02/2002  T:  09/04/2002  Job:  [id number]   cc:   Eduardo Osier. Sharyn Lull, M.D.  [address]  [place]  [place] [address]  Fax: [phone]   Jarome Matin, M.D.  [address] [place]  [place] [address]  Fax: [phone]

## 2010-07-05 NOTE — Discharge Summary (Signed)
NAME:  Monica Wolf, Monica Wolf           ACCOUNT NO.:  [id number]   MEDICAL RECORD NO.:  [id number]          PATIENT TYPE:  INP   LOCATION:  1608                         FACILITY:  [facility]   PHYSICIAN:  Myrtie Neither, MD      DATE OF BIRTH:  [birth year]   DATE OF ADMISSION:  09/25/2006  DATE OF DISCHARGE:  09/27/2006                               DISCHARGE SUMMARY   ADMISSION DIAGNOSES:  1. Degenerative joint disease, right thumb.  2. History of hypertension.  3. Heart valve replacement.  4. Use of long-term anticoagulant.   DISCHARGE DIAGNOSES:  1. Degenerative joint disease, right thumb.  2. History of hypertension.  3. Heart valve replacement.  4. Use of long-term anticoagulant.   COMPLICATIONS:  None.  No infections.   PROCEDURE:  Fusion of right carpometacarpal joint of the thumb.   HISTORY OF PRESENT ILLNESS:  This is a 75 year old female who had been  followed in the office for degenerative arthropathy involving the base  of her right thumb.  The patient had undergone therapeutic injections,  anti-inflammatories and thumb bracing, but symptoms progressively  worsened with difficulty with grasping objections, holding objects and  even at rest with pain, weakness and dropping objects.   PHYSICAL EXAMINATION:  EXTREMITIES:  Right hand with weak pinch and weak  grip with tender crepitus with palpable osteophyte at the  carpometacarpal joint of the thumb.  Neurovascular status was intact.   LABORATORY DATA AND X-RAY FINDINGS:  X-rays revealed degenerative joint  disease at the carpometacarpal joint of right thumb.   HOSPITAL COURSE:  The patient underwent preop laboratory and has been  seeing her cardiologist preoperatively and was approved for clearance.  The patient did have to remain on her anticoagulant.  The patient had  CBC, EKG, chest x-ray, PT/PTT, platelet count, CMET.  The patient's  laboratory was stable enough to undergo surgery.  INR was at 1.9.   The patient  underwent fusion of the right thumb and tolerated the  procedure quite well.  Postoperative course was fairly benign.  Pain was  controlled with PCA as well as Percocet, ice pack and elevation.  The  patient had difficulty with nausea and vomiting and inability to retain  food on the stomach.  She was started on Phenergan and continued on IV  fluids.  She was started on a liquid diet.  The patient became stable  enough to be discharged and was discharged.   DISCHARGE MEDICATIONS:  1. Fosamax 70 mg weekly.  2. Calcium with vitamin D.  3. Percocet 5 mg q.6 h. p.r.n.  4. Ultram ER 100 daily.   SPECIAL INSTRUCTIONS:  Use ice pack and elevation, use of sling.   FOLLOW UP:  Return to the office in 1 week.   CONDITION ON DISCHARGE:  The patient was discharged in stable and  satisfactory condition.      Myrtie Neither, MD  Electronically Signed     AC/MEDQ  D:  10/12/2006  T:  10/13/2006  Job:  [id number]

## 2010-07-05 NOTE — Discharge Summary (Signed)
NAME:  Monica Wolf, Monica Wolf                     ACCOUNT NO.:  [id number]   MEDICAL RECORD NO.:  [id number]                   PATIENT TYPE:  INP   LOCATION:  2041                                 FACILITY:  [facility]   PHYSICIAN:  Salvatore Decent. Dorris Fetch, M.D.         DATE OF BIRTH:  [birth year]   DATE OF ADMISSION:  08/30/2002  DATE OF DISCHARGE:  09/09/2002                                 DISCHARGE SUMMARY   DATE OF SURGERY:  September 02, 2002.   ADMITTING DIAGNOSIS:  Aortic stenosis.   PAST MEDICAL HISTORY:  1. Hypercholesterolemia.  2. History of a heart murmur.  3. History of syncope as a child, none since her teenage years.   ALLERGIES:  The patient has no known drug allergies.   DISCHARGE DIAGNOSIS:  Critical aortic valve stenosis, status post aortic  valve replacement and coronary artery bypass grafting x 1.   BRIEF HISTORY:  Monica Wolf is a 75 year old Caucasian female.  She  presented to [employer]. Harwani, M.D.'s office with complaints of retrosternal  chest tightness grade 3/10 associated with shortness of breath with minimal  exertion.  This was relieved with rest.  It had been going on for two months  prior to her office visit.  Dr. Sharyn Wolf performed an EKG in his office and  revealed left ventricular hypertrophy with T-wave inversion in the lateral  leads.  He recommended proceeding with cardiac catheterization to evaluate  her aortic valve.  The procedure risks and benefits were discussed, and Ms.  Wolf agreed to proceed with cardiac catheterization.   HOSPITAL COURSE:  On August 30, 2002, Monica Wolf was electively admitted to  [facility] under the care of [place]. Sharyn Wolf, M.D.  She underwent  a cardiac catheterization which revealed no significant coronary artery  disease.  An echocardiogram performed also during her hospitalization showed  critical aortic stenosis with an estimated valve area of 0.40 sq cm.  She  was referred to CVTS for consideration of  aortic valve replacement.  She was  evaluated by Salvatore Decent. Dorris Fetch, M.D. on July 13.  After examination of  the patient and review of all of the records including the echocardiogram  and the catheterization films, Dr. Dorris Fetch recommended proceeding with  aortic valve replacement.  He discussed with Monica Wolf the procedure  risks and benefits of both mechanical valve replacement and tissue valve  replacement.  [name] wished to think over her options and discuss this  further with her family.  On July 14, Monica Wolf decided to proceed aortic  valve replacement and decided on mechanical valve.  She understood at that  time that she would be requiring life-long anticoagulation therapy.  All of  her questions were answered, and surgery was scheduled.   Preoperative arterial evaluation, there was no significant carotid artery  disease.  A lower extremity evaluation revealed ABIs 0.90 on the right, 0.97  on the left.   On September 02, 2002,  Monica Wolf underwent the following surgical procedures  with Viviann Spare C. Dorris Fetch, M.D.  (1) Aortic valve replacement with a #19 mm  St. Jude mechanical prosthesis.  (2) Coronary artery bypass grafting x 1,  saphenous vein was grafted to the right coronary artery.  The right coronary  artery graft was placed due to possible impaired blood flow to the right  main coronary ostium.  She tolerated this procedure well, transferred in a  stable condition to the SICU.  She remained hemodynamically stable in the  immediate postoperative period.  She was extubated by the early morning  hours of postoperative day one.  She emerged from anesthesia neurologically  intact.  Low-dose Coumadin therapy was initiated on postoperative day one.  Monica Wolf's postoperative course was slow to progress initially.  She had  some difficulty ambulating with feeling rather weak, and she also had some  early episodes of blurred vision.  This cleared without any  further sequela.  She did have some significant total body volume overload.  This was treated  with diuretics.  Monica Wolf also had some difficulty with her bowels  initially, complaining of constipation and then on postoperative day five,  had several episodes of diarrhea.  Stool was sent for a C. difficile  which  was returned as negative.  She did have one dose of empiric Flagyl.  This  was discontinued when the C. difficile was negative.  Her diarrhea resolved  by the morning of postoperative day six.  On this day, she reported feeling  much better in general and was able to participate more fully in cardiac  rehab and began to eat.   The morning of July 23rd, postoperative day seven, Monica Wolf reports  feeling very well.  Her vital signs are stable with blood pressure 107/63.  She is afebrile.  Room air saturations 93%.  Her heart is maintaining normal  sinus rhythm at about 89 beats per minute.  Her lungs with very crackles in  the left base.  Her breathing is nonlabored even with ambulation.  Her  nausea has been resolved.  She is eating fairly well.  Her urine output is  adequate.  Her incisions are all healing well.  She has no lower extremity  edema.  She is ambulating in the hallway without dyspnea, and her pain is  adequately controlled.  Monica Wolf is ready for discharge this morning,  September 09, 2002.   RECENT LABORATORY STUDIES:  July 21, CBC:  White blood cell 7.9, hemoglobin  9.3, hematocrit 26.3, platelets 174.  Chemistries on July 23 include sodium  at 143, potassium 3.4, BUN 16, creatinine 1.0, glucose 109.  PT 20.9, INR  2.3.  Monica Wolf has not been able to tolerate potassium supplementation  on the last two days; however, since she is feeling better today, she will  be supplemented with potassium today.  This will need to be followed in the  postoperative period as well as her PT and INR.  CONDITION ON DISCHARGE:  Improved.   INSTRUCTIONS ON  DISCHARGE:   MEDICATIONS:  1. Coreg 6.25 mg p.o. b.i.d..  2. Coumadin 2.5 mg p.o. daily or as directed by Eduardo Osier. Harwani, M.D.  3. Prevacid 30 mg p.o. daily.  4. Lipitor 20 mg p.o. daily.  5. For pain, she may have Tylox 1-2 p.o. q.4-6h. for moderate to severe pain     or Tylenol 325 mg 1-2 p.o. q.4-6h. for mild pain.   ACTIVITY:  She is  asked to refrain from any driving or heavy lifting,  pushing, or pulling.  Also instructed to continue her breathing exercises  and daily walking.   DIET:  As tolerated.   WOUND CARE:  She is to shower daily.  If the incisions show any signs of  infection or if she has a fever greater than 101 degrees F, she is to call  Viviann Spare C. Dorris Fetch, M.D.'s office.   FOLLOW UP:  1. She is to have PT and INR blood draw on Monday, July 26, at [facility].  The result will be called to Dr. Annitta Jersey office.  In addition,     BNP has also been ordered for July 26 with the results to be called to     Dr. Annitta Jersey office.  2. Dr. Sharyn Wolf would like to see her in his office on August 2, at 10:00 in     the morning.  3. Dr. Dorris Fetch would like to see her in his office Monday, August 9, at     1:15.  She is being asked to have a chest x-ray at [facility] at 12:00 that day.      Toribio Harbour, N.P.                  Salvatore Decent Dorris Fetch, M.D.    CTK/MEDQ  D:  09/09/2002  T:  09/10/2002  Job:  [id number]   cc:   Eduardo Osier. Sharyn Wolf, M.D.  [address]  [place]  [place] [address]  Fax: [phone]   Salvatore Decent. Dorris Fetch, M.D.  [address]  [place]  [place] [address]  Fax: [phone]   Jarome Matin, M.D.  [address] [place]  [place] [address]  Fax: [phone]

## 2010-10-08 ENCOUNTER — Telehealth (INDEPENDENT_AMBULATORY_CARE_PROVIDER_SITE_OTHER): Payer: Self-pay

## 2010-10-08 NOTE — Telephone Encounter (Signed)
Returned pt's voicemail left yesterday on Dr [employer]. The pt wanted to make f/u appt for her husband. The appt. Was made for 10-24-10 to recheck after lap chole.Hulda Humphrey

## 2010-10-16 ENCOUNTER — Ambulatory Visit (HOSPITAL_COMMUNITY)
Admission: RE | Admit: 2010-10-16 | Discharge: 2010-10-16 | Disposition: A | Discharge status: Home / Self Care | Payer: Medicare Other | Source: Ambulatory Visit | Attending: Ophthalmology | Admitting: Ophthalmology

## 2010-10-16 DIAGNOSIS — H269 Unspecified cataract: Secondary | ICD-10-CM | POA: Insufficient documentation

## 2010-11-07 LAB — PROTIME-INR
INR: 1.5
INR: 1.7 — ABNORMAL HIGH
INR: 2.8 — ABNORMAL HIGH
Prothrombin Time: 15.8 — ABNORMAL HIGH
Prothrombin Time: 17.1 — ABNORMAL HIGH
Prothrombin Time: 18.1 — ABNORMAL HIGH
Prothrombin Time: 18.4 — ABNORMAL HIGH
Prothrombin Time: 19.7 — ABNORMAL HIGH
Prothrombin Time: 20.2 — ABNORMAL HIGH
Prothrombin Time: 30.6 — ABNORMAL HIGH

## 2010-11-07 LAB — HEPARIN LEVEL (UNFRACTIONATED)
Heparin Unfractionated: 0.18 — ABNORMAL LOW
Heparin Unfractionated: 0.2 — ABNORMAL LOW
Heparin Unfractionated: 0.3
Heparin Unfractionated: 0.38
Heparin Unfractionated: 0.47
Heparin Unfractionated: 0.52
Heparin Unfractionated: 1.02 — ABNORMAL HIGH

## 2010-11-07 LAB — CBC
HCT: 22.5 — ABNORMAL LOW
HCT: 27.6 — ABNORMAL LOW
HCT: 30.6 — ABNORMAL LOW
HCT: 34.9 — ABNORMAL LOW
Hemoglobin: 11.2 — ABNORMAL LOW
Hemoglobin: 12.7
Hemoglobin: 7.7 — CL
Hemoglobin: 9 — ABNORMAL LOW
Hemoglobin: 9.4 — ABNORMAL LOW
MCHC: 33.7
MCHC: 33.9
MCHC: 34.2
MCHC: 34.2
MCHC: 34.6
MCHC: 34.6
MCV: 91.8
MCV: 92.2
MCV: 92.2
MCV: 92.3
MCV: 92.6
MCV: 93.5
Platelets: 103 — ABNORMAL LOW
Platelets: 121 — ABNORMAL LOW
Platelets: 125 — ABNORMAL LOW
RBC: 3.01 — ABNORMAL LOW
RBC: 3.33 — ABNORMAL LOW
RBC: 4
RDW: 14.1
RDW: 14.4
RDW: 14.5
RDW: 14.5
RDW: 14.5
RDW: 15.4
WBC: 4.5

## 2010-11-07 LAB — CROSSMATCH: Antibody Screen: NEGATIVE

## 2010-11-07 LAB — BASIC METABOLIC PANEL
BUN: 6
CO2: 26
CO2: 27
CO2: 28
CO2: 28
CO2: 28
Calcium: 8.5
Calcium: 8.9
Chloride: 106
Chloride: 108
Chloride: 109
Creatinine, Ser: 0.7
Creatinine, Ser: 0.75
Creatinine, Ser: 0.85
GFR calc Af Amer: >60
GFR calc Af Amer: >60
GFR calc non Af Amer: 58 — ABNORMAL LOW
GFR calc non Af Amer: >60
Glucose, Bld: 101 — ABNORMAL HIGH
Glucose, Bld: 89
Glucose, Bld: 89
Potassium: 3.7
Sodium: 140
Sodium: 140
Sodium: 140

## 2010-11-07 LAB — URINALYSIS, ROUTINE W REFLEX MICROSCOPIC
Bilirubin Urine: NEGATIVE
Glucose, UA: NEGATIVE
Hgb urine dipstick: NEGATIVE
Ketones, ur: NEGATIVE
Protein, ur: NEGATIVE

## 2010-11-07 LAB — HEMATOCRIT: HCT: 28.4 — ABNORMAL LOW

## 2010-12-02 LAB — CBC
HCT: 36.8
MCHC: 34
MCV: 91.4
RBC: 4.02
WBC: 4.6

## 2010-12-02 LAB — COMPREHENSIVE METABOLIC PANEL
BUN: 14
CO2: 29
Calcium: 9.2
Chloride: 105
Creatinine, Ser: 0.97
GFR calc Af Amer: >60
GFR calc non Af Amer: 57 — ABNORMAL LOW
Glucose, Bld: 84
Total Bilirubin: 0.7

## 2010-12-02 LAB — URINALYSIS, ROUTINE W REFLEX MICROSCOPIC
Bilirubin Urine: NEGATIVE
Glucose, UA: NEGATIVE
Hgb urine dipstick: NEGATIVE
Specific Gravity, Urine: 1.015
Urobilinogen, UA: 1

## 2010-12-02 LAB — PROTIME-INR
INR: 1.9 — ABNORMAL HIGH
INR: 4 — ABNORMAL HIGH
Prothrombin Time: 22.5 — ABNORMAL HIGH
Prothrombin Time: 30.7 — ABNORMAL HIGH

## 2010-12-02 LAB — APTT: aPTT: 38 — ABNORMAL HIGH

## 2011-01-30 ENCOUNTER — Other Ambulatory Visit: Payer: Self-pay | Admitting: Orthopedic Surgery

## 2011-01-30 DIAGNOSIS — M25511 Pain in right shoulder: Secondary | ICD-10-CM

## 2011-02-04 ENCOUNTER — Ambulatory Visit
Admission: RE | Admit: 2011-02-04 | Discharge: 2011-02-04 | Disposition: A | Discharge status: Home / Self Care | Payer: Medicare Other | Source: Ambulatory Visit | Attending: Orthopedic Surgery | Admitting: Orthopedic Surgery

## 2011-02-04 DIAGNOSIS — M25511 Pain in right shoulder: Secondary | ICD-10-CM

## 2011-04-17 ENCOUNTER — Other Ambulatory Visit: Payer: Self-pay | Admitting: Cardiology

## 2011-05-12 ENCOUNTER — Other Ambulatory Visit: Payer: Self-pay | Admitting: Family Medicine

## 2011-05-12 DIAGNOSIS — Z1231 Encounter for screening mammogram for malignant neoplasm of breast: Secondary | ICD-10-CM

## 2011-06-16 ENCOUNTER — Other Ambulatory Visit: Payer: Self-pay | Admitting: Cardiology

## 2011-06-20 ENCOUNTER — Ambulatory Visit
Admission: RE | Admit: 2011-06-20 | Discharge: 2011-06-20 | Disposition: A | Discharge status: Home / Self Care | Payer: Medicare Other | Source: Ambulatory Visit | Attending: Family Medicine | Admitting: Family Medicine

## 2011-06-20 DIAGNOSIS — Z1231 Encounter for screening mammogram for malignant neoplasm of breast: Secondary | ICD-10-CM

## 2011-06-23 ENCOUNTER — Other Ambulatory Visit: Payer: Self-pay | Admitting: Ophthalmology

## 2011-06-23 NOTE — H&P (Signed)
  Pre-operative History and Physical for Ophthalmic Surgery  Monica Wolf 06/23/2011                  Chief Complaint: Decreased vision  Diagnosis: Cataract Left eye  Allergies not on file  no  Reaction:  none  Prior to Admission medications   Not on File    Planned Procedure:                                       Phacoemulsification, Posterior Chamber Intra-ocular Lens Left Eye                                      Acrysof Ma 50 BM  +21.00 Diopter PC IOL  for implant OS  There were no vitals filed for this visit.  Pulse: 76         Temp: NE        Resp:  17    ROS:  non-contributory  No past medical history on file.  No past surgical history on file.   History   Social History  . Marital Status: Married    Spouse Name: N/A    Number of Children: N/A  . Years of Education: N/A   Occupational History  . Not on file.   Social History Main Topics  . Smoking status: Not on file  . Smokeless tobacco: Not on file  . Alcohol Use: Not on file  . Drug Use: Not on file  . Sexually Active: Not on file   Other Topics Concern  . Not on file   Social History Narrative  . No narrative on file     The following examination is for anesthesia clearance for minimally invasive Ophthalmic surgery. It is primarily to document heart and lung findings and is not intended to elucidate unknown general medical conditions inclusive of abdominal masses, lung lesions, etc.   General Constitution:  Within Normal Limits   Alertness/Orientation:  Person, time place     yes   HEENT:  Eye Findings: Cataract                    left eye  Neck: supple without masses  Chest/Lungs: clear to auscultation  Cardiac: Normal S1 and S2 without Murmur, S3 or S4  Neuro: non-focal   Impression: Cataract Left Eye  Planned Procedure:  Phacoemulsification, Posterior Chamber Intraocular Lens                                      Left eye    Shade Flood, MD

## 2011-07-03 ENCOUNTER — Encounter (HOSPITAL_COMMUNITY): Payer: Self-pay | Admitting: Pharmacy Technician

## 2011-07-09 ENCOUNTER — Inpatient Hospital Stay (HOSPITAL_COMMUNITY): Admission: RE | Admit: 2011-07-09 | Discharge: 2011-07-09 | Payer: Medicare Other | Source: Ambulatory Visit

## 2011-07-09 ENCOUNTER — Encounter (HOSPITAL_COMMUNITY): Payer: Self-pay

## 2011-07-09 HISTORY — DX: Unspecified osteoarthritis, unspecified site: M19.90

## 2011-07-09 NOTE — Progress Notes (Signed)
hAVE REQUESTED CXR FROM [facility] AND LAST OFFICE NOTE FROM HARWANI---DA

## 2011-07-09 NOTE — Pre-Procedure Instructions (Signed)
20 DESI CARBY   07/09/2011   Your procedure is scheduled on:  May 29th, Wednesday   Report to [facility] at   12:30  PM.  Call this number if you have problems the morning of surgery: [phone]   Remember:   Do not eat food:After Midnight Tuesday .  May have clear liquids: up to 4 Hours before arrival time -- 8:30 AM.  Clear liquids include soda, tea, black coffee, apple or grape juice, broth.   Take these medicines the morning of surgery with A SIP OF WATER:  Coreg   Do not wear jewelry, make-up or nail polish.  Do not wear lotions, powders, or perfumes. You may wear deodorant.  Do not shave 48 hours prior to surgery. Men may shave face and neck.  Do not bring valuables to the hospital.  Contacts, dentures or bridgework may not be worn into surgery.  Leave suitcase in the car. After surgery it may be brought to your room.  For patients admitted to the hospital, checkout time is 11:00 AM the day of discharge.   Patients discharged the day of surgery will not be allowed to drive home.  Name and phone number of your driver:   Special Instructions: CHG Shower Use Special Wash: 1/2 bottle night before surgery and 1/2 bottle morning of surgery.   Please read over the following fact sheets that you were given: Pain Booklet, MRSA Information and Surgical Site Infection Prevention

## 2011-07-10 ENCOUNTER — Encounter (HOSPITAL_COMMUNITY)
Admission: RE | Admit: 2011-07-10 | Discharge: 2011-07-10 | Disposition: A | Discharge status: Home / Self Care | Payer: Medicare Other | Source: Ambulatory Visit | Attending: Ophthalmology | Admitting: Ophthalmology

## 2011-07-10 LAB — SURGICAL PCR SCREEN: MRSA, PCR: NEGATIVE

## 2011-07-15 MED ORDER — PHENYLEPHRINE HCL 2.5 % OP SOLN
1.0000 [drp] | OPHTHALMIC | Status: AC | PRN
  Administered 2011-07-16 (×3): 1 [drp] via OPHTHALMIC

## 2011-07-15 MED ORDER — GATIFLOXACIN 0.5 % OP SOLN
1.0000 [drp] | OPHTHALMIC | Status: AC | PRN
  Administered 2011-07-16 (×3): 1 [drp] via OPHTHALMIC

## 2011-07-15 MED ORDER — PREDNISOLONE ACETATE 1 % OP SUSP
1.0000 [drp] | OPHTHALMIC | Status: AC
  Administered 2011-07-16: 1 [drp] via OPHTHALMIC

## 2011-07-15 MED ORDER — TETRACAINE HCL 0.5 % OP SOLN: 2.0000 [drp] | OPHTHALMIC | Status: DC

## 2011-07-15 NOTE — Progress Notes (Signed)
Pt called and notified of time change( procedure: 1144, arrival: 0944). She verbalized understanding.//L. Samari Gorby,RN

## 2011-07-16 ENCOUNTER — Encounter (HOSPITAL_COMMUNITY): Payer: Self-pay | Admitting: Anesthesiology

## 2011-07-16 ENCOUNTER — Encounter (HOSPITAL_COMMUNITY)
Admission: RE | Disposition: A | Discharge status: Home / Self Care | Payer: Self-pay | Source: Ambulatory Visit | Attending: Ophthalmology

## 2011-07-16 ENCOUNTER — Ambulatory Visit (HOSPITAL_COMMUNITY): Payer: Medicare Other

## 2011-07-16 ENCOUNTER — Encounter (HOSPITAL_COMMUNITY): Payer: Self-pay | Admitting: *Deleted

## 2011-07-16 ENCOUNTER — Ambulatory Visit (HOSPITAL_COMMUNITY): Payer: Medicare Other | Admitting: Anesthesiology

## 2011-07-16 ENCOUNTER — Ambulatory Visit (HOSPITAL_COMMUNITY)
Admission: RE | Admit: 2011-07-16 | Discharge: 2011-07-16 | Disposition: A | Discharge status: Home / Self Care | Payer: Medicare Other | Source: Ambulatory Visit | Attending: Ophthalmology | Admitting: Ophthalmology

## 2011-07-16 DIAGNOSIS — Z01812 Encounter for preprocedural laboratory examination: Secondary | ICD-10-CM | POA: Insufficient documentation

## 2011-07-16 DIAGNOSIS — H269 Unspecified cataract: Secondary | ICD-10-CM | POA: Insufficient documentation

## 2011-07-16 DIAGNOSIS — I10 Essential (primary) hypertension: Secondary | ICD-10-CM | POA: Insufficient documentation

## 2011-07-16 HISTORY — DX: Nausea with vomiting, unspecified: R11.2

## 2011-07-16 HISTORY — DX: Other specified postprocedural states: Z98.890

## 2011-07-16 HISTORY — DX: Nonrheumatic aortic (valve) stenosis: I35.0

## 2011-07-16 HISTORY — DX: Essential (primary) hypertension: I10

## 2011-07-16 HISTORY — PX: CATARACT EXTRACTION W/PHACO: SHX586

## 2011-07-16 LAB — CBC
Platelets: 137 10*3/uL — ABNORMAL LOW (ref 150–400)
RBC: 4.07 MIL/uL (ref 3.87–5.11)
RDW: 15.3 % (ref 11.5–15.5)
WBC: 3.8 10*3/uL — ABNORMAL LOW (ref 4.0–10.5)

## 2011-07-16 LAB — BASIC METABOLIC PANEL
Calcium: 8.9 mg/dL (ref 8.4–10.5)
Creatinine, Ser: 0.96 mg/dL (ref 0.50–1.10)
GFR calc Af Amer: 66 mL/min — ABNORMAL LOW (ref >90)
Sodium: 140 mEq/L (ref 135–145)

## 2011-07-16 LAB — PROTIME-INR
INR: 2.95 — ABNORMAL HIGH (ref 0.00–1.49)
Prothrombin Time: 31.2 seconds — ABNORMAL HIGH (ref 11.6–15.2)

## 2011-07-16 SURGERY — PHACOEMULSIFICATION, CATARACT, WITH IOL INSERTION
Anesthesia: Monitor Anesthesia Care | Site: Eye | Laterality: Left | Wound class: Clean

## 2011-07-16 MED ORDER — NA CHONDROIT SULF-NA HYALURON 40-30 MG/ML IO SOLN
INTRAOCULAR | Status: DC | PRN
  Administered 2011-07-16: 0.5 mL via INTRAOCULAR

## 2011-07-16 MED ORDER — BACITRACIN-POLYMYXIN B 500-10000 UNIT/GM OP OINT
TOPICAL_OINTMENT | OPHTHALMIC | Status: DC | PRN
  Administered 2011-07-16: 1 via OPHTHALMIC

## 2011-07-16 MED ORDER — LIDOCAINE HCL (CARDIAC) 20 MG/ML IV SOLN
INTRAVENOUS | Status: DC | PRN
  Administered 2011-07-16: 50 mg via INTRAVENOUS

## 2011-07-16 MED ORDER — MIDAZOLAM HCL 5 MG/5ML IJ SOLN
INTRAMUSCULAR | Status: DC | PRN
  Administered 2011-07-16: 1 mg via INTRAVENOUS

## 2011-07-16 MED ORDER — CEFAZOLIN SUBCONJUNCTIVAL INJECTION 100 MG/0.5 ML
200.0000 mg | INJECTION | SUBCONJUNCTIVAL | Status: AC
  Administered 2011-07-16: 200 mg via SUBCONJUNCTIVAL
  Filled 2011-07-16: qty 1

## 2011-07-16 MED ORDER — HYPROMELLOSE (GONIOSCOPIC) 2.5 % OP SOLN
OPHTHALMIC | Status: DC | PRN
  Administered 2011-07-16: 1 [drp] via OPHTHALMIC

## 2011-07-16 MED ORDER — PHENYLEPHRINE HCL 2.5 % OP SOLN
OPHTHALMIC | Status: AC
  Administered 2011-07-16: 1 [drp] via OPHTHALMIC
  Filled 2011-07-16: qty 3

## 2011-07-16 MED ORDER — PROPOFOL 10 MG/ML IV EMUL
INTRAVENOUS | Status: DC | PRN
  Administered 2011-07-16: 20 mg via INTRAVENOUS

## 2011-07-16 MED ORDER — LIDOCAINE HCL (PF) 2 % IJ SOLN
INTRAMUSCULAR | Status: DC | PRN
  Administered 2011-07-16: 10 mL

## 2011-07-16 MED ORDER — LACTATED RINGERS IV SOLN: INTRAVENOUS | Status: DC

## 2011-07-16 MED ORDER — TETRACAINE HCL 0.5 % OP SOLN
OPHTHALMIC | Status: AC
  Administered 2011-07-16: 2 [drp] via OPHTHALMIC
  Filled 2011-07-16: qty 2

## 2011-07-16 MED ORDER — GATIFLOXACIN 0.5 % OP SOLN
OPHTHALMIC | Status: AC
  Administered 2011-07-16: 1 [drp] via OPHTHALMIC
  Filled 2011-07-16: qty 2.5

## 2011-07-16 MED ORDER — ONDANSETRON HCL 4 MG/2ML IJ SOLN
INTRAMUSCULAR | Status: DC | PRN
  Administered 2011-07-16: 4 mg via INTRAVENOUS

## 2011-07-16 MED ORDER — SODIUM CHLORIDE 0.9 % IV SOLN
INTRAVENOUS | Status: DC
  Administered 2011-07-16: 11:00:00 via INTRAVENOUS

## 2011-07-16 MED ORDER — BUPIVACAINE HCL 0.75 % IJ SOLN
INTRAMUSCULAR | Status: DC | PRN
  Administered 2011-07-16: 10 mL

## 2011-07-16 MED ORDER — PROVISC 10 MG/ML IO SOLN
INTRAOCULAR | Status: DC | PRN
  Administered 2011-07-16: .85 mL via INTRAOCULAR

## 2011-07-16 MED ORDER — PREDNISOLONE ACETATE 1 % OP SUSP
OPHTHALMIC | Status: AC
  Administered 2011-07-16: 1 [drp] via OPHTHALMIC
  Filled 2011-07-16: qty 5

## 2011-07-16 MED ORDER — DEXAMETHASONE SODIUM PHOSPHATE 10 MG/ML IJ SOLN
INTRAMUSCULAR | Status: DC | PRN
  Administered 2011-07-16: 10 mg

## 2011-07-16 MED ORDER — FENTANYL CITRATE 0.05 MG/ML IJ SOLN
INTRAMUSCULAR | Status: DC | PRN
  Administered 2011-07-16: 50 ug via INTRAVENOUS

## 2011-07-16 MED ORDER — EPINEPHRINE HCL 1 MG/ML IJ SOLN
INTRAOCULAR | Status: DC | PRN
  Administered 2011-07-16: 12:00:00

## 2011-07-16 MED ORDER — WATER FOR IRRIGATION, STERILE IR SOLN
Status: DC | PRN
  Administered 2011-07-16: 1000 mL

## 2011-07-16 MED ORDER — SODIUM CHLORIDE 0.9 % IV SOLN
INTRAVENOUS | Status: DC | PRN
  Administered 2011-07-16: 12:00:00 via INTRAVENOUS

## 2011-07-16 MED ORDER — 0.9 % SODIUM CHLORIDE (POUR BTL) OPTIME
TOPICAL | Status: DC | PRN
  Administered 2011-07-16: 1000 mL

## 2011-07-16 SURGICAL SUPPLY — 63 items
APL SRG 3 HI ABS STRL LF PLS (MISCELLANEOUS) ×1
APPLICATOR COTTON TIP 6IN STRL (MISCELLANEOUS) ×2 IMPLANT
APPLICATOR DR MATTHEWS STRL (MISCELLANEOUS) ×2 IMPLANT
BAG FLD CLT MN 6.25X3.5 (WOUND CARE) ×1
BAG MINI COLL DRAIN (WOUND CARE) ×2 IMPLANT
BLADE EYE MINI 60D BEAVER (BLADE) IMPLANT
BLADE KERATOME 2.75 (BLADE) ×2 IMPLANT
BLADE STAB KNIFE 15DEG (BLADE) IMPLANT
CANNULA ANTERIOR CHAMBER 27GA (MISCELLANEOUS) IMPLANT
CLOTH BEACON ORANGE TIMEOUT ST (SAFETY) ×2 IMPLANT
DRAPE OPHTHALMIC 77X100 STRL (CUSTOM PROCEDURE TRAY) ×2 IMPLANT
DRAPE POUCH INSTRU U-SHP 10X18 (DRAPES) ×2 IMPLANT
DRSG TEGADERM 4X4.75 (GAUZE/BANDAGES/DRESSINGS) ×2 IMPLANT
FILTER BLUE MILLIPORE (MISCELLANEOUS) IMPLANT
GLOVE ECLIPSE 6.5 STRL STRAW (GLOVE) ×2 IMPLANT
GLOVE SS BIOGEL STRL SZ 6.5 (GLOVE) ×1 IMPLANT
GLOVE SUPERSENSE BIOGEL SZ 6.5 (GLOVE) ×2
GOWN SRG XL XLNG 56XLVL 4 (GOWN DISPOSABLE) ×1 IMPLANT
GOWN STRL NON-REIN LRG LVL3 (GOWN DISPOSABLE) ×2 IMPLANT
GOWN STRL NON-REIN XL XLG LVL4 (GOWN DISPOSABLE) ×2
KIT BASIN OR (CUSTOM PROCEDURE TRAY) ×2 IMPLANT
KIT ROOM TURNOVER OR (KITS) IMPLANT
KNIFE GRIESHABER SHARP 2.5MM (MISCELLANEOUS) ×2 IMPLANT
LENS IOL ACRYSOF MP POST 21.0 (Intraocular Lens) ×1 IMPLANT
MASK EYE SHIELD (GAUZE/BANDAGES/DRESSINGS) ×1 IMPLANT
NDL 18GX1X1/2 (RX/OR ONLY) (NEEDLE) IMPLANT
NDL 25GX 5/8IN NON SAFETY (NEEDLE) ×1 IMPLANT
NDL FILTER BLUNT 18X1 1/2 (NEEDLE) IMPLANT
NDL HYPO 30X.5 LL (NEEDLE) ×2 IMPLANT
NEEDLE 18GX1X1/2 (RX/OR ONLY) (NEEDLE) ×2 IMPLANT
NEEDLE 22X1 1/2 (OR ONLY) (NEEDLE) ×2 IMPLANT
NEEDLE 25GX 5/8IN NON SAFETY (NEEDLE) ×2 IMPLANT
NEEDLE FILTER BLUNT 18X 1/2SAF (NEEDLE)
NEEDLE FILTER BLUNT 18X1 1/2 (NEEDLE) IMPLANT
NEEDLE HYPO 30X.5 LL (NEEDLE) ×4 IMPLANT
NS IRRIG 1000ML POUR BTL (IV SOLUTION) ×2 IMPLANT
PACK CATARACT CUSTOM (CUSTOM PROCEDURE TRAY) ×2 IMPLANT
PACK CATARACT MCHSCP (PACKS) ×2 IMPLANT
PACK COMBINED CATERACT/VIT 23G (OPHTHALMIC RELATED) IMPLANT
PAD ARMBOARD 7.5X6 YLW CONV (MISCELLANEOUS) ×4 IMPLANT
PAD EYE OVAL STERILE LF (GAUZE/BANDAGES/DRESSINGS) ×1 IMPLANT
PHACO TIP KELMAN 45DEG (TIP) ×2 IMPLANT
PROBE ANTERIOR 20G W/INFUS NDL (MISCELLANEOUS) IMPLANT
ROLLS DENTAL (MISCELLANEOUS) IMPLANT
SHUTTLE MONARCH TYPE A (NEEDLE) ×2 IMPLANT
SOLUTION ANTI FOG 6CC (MISCELLANEOUS) ×1 IMPLANT
SPEAR EYE SURG WECK-CEL (MISCELLANEOUS) ×2 IMPLANT
SUT ETHILON 10-0 CS-B-6CS-B-6 (SUTURE)
SUT ETHILON 5 0 P 3 18 (SUTURE)
SUT ETHILON 9 0 TG140 8 (SUTURE) IMPLANT
SUT NYLON ETHILON 5-0 P-3 1X18 (SUTURE) IMPLANT
SUT PLAIN 6 0 TG1408 (SUTURE) IMPLANT
SUT POLY NON ABSORB 10-0 8 STR (SUTURE) IMPLANT
SUT VICRYL 6 0 S 29 12 (SUTURE) IMPLANT
SUTURE EHLN 10-0 CS-B-6CS-B-6 (SUTURE) IMPLANT
SYR 20CC LL (SYRINGE) IMPLANT
SYR 5ML LL (SYRINGE) IMPLANT
SYR TB 1ML LUER SLIP (SYRINGE) IMPLANT
SYRINGE 10CC LL (SYRINGE) IMPLANT
TAPE PAPER 3X10 WHT MICROPORE (GAUZE/BANDAGES/DRESSINGS) ×1 IMPLANT
TOWEL OR 17X24 6PK STRL BLUE (TOWEL DISPOSABLE) ×4 IMPLANT
WATER STERILE IRR 1000ML POUR (IV SOLUTION) ×2 IMPLANT
WIPE INSTRUMENT VISIWIPE 73X73 (MISCELLANEOUS) ×2 IMPLANT

## 2011-07-16 NOTE — Transfer of Care (Signed)
Immediate Anesthesia Transfer of Care Note  Patient: Monica Wolf  Procedure(s) Performed: Procedure(s) (LRB): CATARACT EXTRACTION PHACO AND INTRAOCULAR LENS PLACEMENT (IOC) (Left)  Patient Location: Short Stay  Anesthesia Type: MAC  Level of Consciousness: awake, alert  and oriented  Airway & Oxygen Therapy: Patient Spontanous Breathing  Post-op Assessment: Report given to PACU RN  Post vital signs: Reviewed  Complications: No apparent anesthesia complications

## 2011-07-16 NOTE — Preoperative (Signed)
Beta Blockers   Reason not to administer Beta Blockers:Not Applicable 

## 2011-07-16 NOTE — Op Note (Signed)
Monica Wolf 07/16/2011 Cataract   Procedure: Phacoemulsification, Posterior Chamber Intra-ocular Lens Operative Eye:  left eye  Surgeon: Shade Flood Estimated Blood Loss: minimal Specimens for Pathology:  None Complications: none  The patient was prepared and draped in the usual manner for ocular surgery on the left eye. A Cook lid speculum was placed. A peripheral clear corneal incision was made at the surgical limbus centered at the 11:00 meridian. A separate clear corneal stab incision was made with a 15 degree blade at the 2:00 meridian to permit bi-manual technique. Provisc was instilled into the anterior chamber through that incision.  A keratome was used to create a self sealing incision entering the anterior chamber at the 11:00 meridian. A capsulorhexis was performed using a bent 25g needle. The lens was hydrodissected and the nucleus was hydrodilineated using a Nichammin cannula. The Chang chopper was inserted and used to rotate the lens to insure adequate lens mobility. The phacoemulsification handpiece was inserted and a combined phaco-chop technique was employed, fracturing the lens into separate sections with subsequent removal with the phaco handpiece.   The I/A cannula was used to remove remaining lens cortex. Provisc was instilled and used to deepen the anterior chamber and posterior capsule bag. The Monarch injector was used to place a folded Acrysof [id number] PC IOL, + 21.00  diopters, into the capsule bag. A Sinskey lens hook was used to dial in the trailing haptic.  The I/A cannula was used to remove the viscoelastic from the anterior chamber. BSS was used to bring IOP to the desired range and the wound was checked to insure it was watertight. Subconjunctival injections of Ance 100/0.48ml and Dexamethasone 4mg /63ml were placed without complication. The lid speculum and drapes were removed and the patient's eye was patched with Polymixin/Bacitracin ophthalmic ointment. An eye  shield was placed and the patient was transferred alert and conversant from the operating room to the post-operative recovery area.   Shade Flood, MD

## 2011-07-16 NOTE — Discharge Instructions (Signed)
WANONA STARE      07/16/2011  Post-operative instructions for Deloyd Handy L. Clarisa Kindred, MD  Caring for your eye:  Do not rub your eye and wash your hands before touching the eye area. This is important to avoid injury and infection.  You may use sterile gauze pads and sterile eye wash to cleanse the lid margins of mucous accumulation.  DO NOT REUSE GAUZE after wiping the eye. Use a new clean one if needed.  Be certain not to touch the top of the medication bottle to the eyelids to avoid contaminating your medicine bottle and causing infection.  After eye surgery the surface of the eye and eyelids may be puffy. You may note a red blotch(s) on the surface of the eye or a bruise on your eyelid. These are usually related to injections or instruments used in surgery and are not cause for alarm. You may also notice blood tinged tears on your eye pad, this is common and not cause for alarm. These findings will subside over the coming week or two.  Activity:  No jarring activities. Walking with assistance early on as needed is advised. Avoid straining and let me know if you have significant constipation. Do not bend over at the waist with the head below your waist to minimize risk of bleeding inside the eye.  Avoid getting water from washing your hair or showering  in your eye. Patch the eye if necessary during bathing to avoid contamination.    You may: watch television, work on you computer, read books, eat out, ride in a car.  Sleeping Position:    you do not have a gas bubble in your eye, you may sleep in your         customary manner.    Wear your eye shield for naps and sleeping at night for the first two weeks.   Wait a few minutes between your eye drops when placing them.   Resume your customary medications on your normal schedule. Shade Flood MD    After office hours I can be reached by calling: [phone]                                                                      or   [phone]

## 2011-07-16 NOTE — Anesthesia Postprocedure Evaluation (Signed)
  Anesthesia Post-op Note  Patient: Monica Wolf  Procedure(s) Performed: Procedure(s) (LRB): CATARACT EXTRACTION PHACO AND INTRAOCULAR LENS PLACEMENT (IOC) (Left)  Patient Location: Short Stay  Anesthesia Type: MAC  Level of Consciousness: awake, alert , oriented and patient cooperative  Airway and Oxygen Therapy: Patient Spontanous Breathing  Post-op Pain: none  Post-op Assessment: Post-op Vital signs reviewed, Patient's Cardiovascular Status Stable, Respiratory Function Stable, Patent Airway and No signs of Nausea or vomiting  Post-op Vital Signs: Reviewed  Complications: No apparent anesthesia complications

## 2011-07-16 NOTE — Anesthesia Preprocedure Evaluation (Addendum)
Anesthesia Evaluation  Patient identified by MRN, date of birth, ID band Patient awake    Reviewed: Allergy & Precautions, H&P , NPO status , Patient's Chart, lab work & pertinent test results, reviewed documented beta blocker date and time   History of Anesthesia Complications (+) PONV  Airway Mallampati: II  Neck ROM: full    Dental  (+) Teeth Intact and Dental Advisory Given   Pulmonary  breath sounds clear to auscultation        Cardiovascular hypertension, Pt. on home beta blockers + Valvular Problems/Murmurs Rhythm:Regular Rate:Normal + Systolic Click S/p AVR   Neuro/Psych    GI/Hepatic   Endo/Other    Renal/GU      Musculoskeletal  (+) Arthritis -,   Abdominal   Peds  Hematology   Anesthesia Other Findings   Reproductive/Obstetrics                         Anesthesia Physical Anesthesia Plan  ASA: III  Anesthesia Plan: MAC   Post-op Pain Management:    Induction: Intravenous  Airway Management Planned: Nasal Cannula  Additional Equipment:   Intra-op Plan:   Post-operative Plan:   Informed Consent: I have reviewed the patients History and Physical, chart, labs and discussed the procedure including the risks, benefits and alternatives for the proposed anesthesia with the patient or authorized representative who has indicated his/her understanding and acceptance.     Plan Discussed with: CRNA and Surgeon  Anesthesia Plan Comments: (S/P AVR 2004 doing well.  Plan MAC)       Anesthesia Quick Evaluation

## 2011-07-16 NOTE — Interval H&P Note (Signed)
History and Physical Interval Note:  07/16/2011 10:23 AM  Monica Wolf  has presented today for surgery, with the diagnosis of Cataract Left Eye  The various methods of treatment have been discussed with the patient and family. After consideration of risks, benefits and other options for treatment, the patient has consented to  Procedure(s) (LRB): CATARACT EXTRACTION PHACO AND INTRAOCULAR LENS PLACEMENT (IOC) (Left) as a surgical intervention .  The patients' history has been reviewed, patient examined, no change in status, stable for surgery.  I have reviewed the patients' chart and labs.  Questions were answered to the patient's satisfaction.     Shade Flood, MD

## 2011-07-16 NOTE — Anesthesia Procedure Notes (Signed)
Procedure Name: MAC Date/Time: 07/16/2011 11:46 AM Performed by: Lovie Chol Pre-anesthesia Checklist: Patient identified, Emergency Drugs available, Suction available, Patient being monitored and Timeout performed Patient Re-evaluated:Patient Re-evaluated prior to inductionOxygen Delivery Method: Nasal cannula Intubation Type: IV induction Placement Confirmation: positive ETCO2

## 2011-07-17 ENCOUNTER — Other Ambulatory Visit: Payer: Self-pay | Admitting: Cardiology

## 2011-07-17 ENCOUNTER — Encounter (HOSPITAL_COMMUNITY): Payer: Self-pay | Admitting: Ophthalmology

## 2011-07-17 ENCOUNTER — Inpatient Hospital Stay (HOSPITAL_COMMUNITY): Admission: RE | Admit: 2011-07-17 | Payer: Medicare Other | Source: Ambulatory Visit

## 2011-07-22 ENCOUNTER — Emergency Department (HOSPITAL_COMMUNITY)
Admission: EM | Admit: 2011-07-22 | Discharge: 2011-07-23 | Disposition: A | Discharge status: Home / Self Care | Payer: Medicare Other | Attending: Emergency Medicine | Admitting: Emergency Medicine

## 2011-07-22 ENCOUNTER — Encounter (HOSPITAL_COMMUNITY): Payer: Self-pay

## 2011-07-22 ENCOUNTER — Emergency Department (HOSPITAL_COMMUNITY): Payer: Medicare Other

## 2011-07-22 DIAGNOSIS — Z954 Presence of other heart-valve replacement: Secondary | ICD-10-CM | POA: Insufficient documentation

## 2011-07-22 DIAGNOSIS — R112 Nausea with vomiting, unspecified: Secondary | ICD-10-CM | POA: Insufficient documentation

## 2011-07-22 DIAGNOSIS — K5900 Constipation, unspecified: Secondary | ICD-10-CM | POA: Insufficient documentation

## 2011-07-22 DIAGNOSIS — R197 Diarrhea, unspecified: Secondary | ICD-10-CM | POA: Insufficient documentation

## 2011-07-22 DIAGNOSIS — Z7901 Long term (current) use of anticoagulants: Secondary | ICD-10-CM | POA: Insufficient documentation

## 2011-07-22 DIAGNOSIS — R109 Unspecified abdominal pain: Secondary | ICD-10-CM

## 2011-07-22 DIAGNOSIS — M129 Arthropathy, unspecified: Secondary | ICD-10-CM | POA: Insufficient documentation

## 2011-07-22 DIAGNOSIS — I10 Essential (primary) hypertension: Secondary | ICD-10-CM | POA: Insufficient documentation

## 2011-07-22 DIAGNOSIS — Z79899 Other long term (current) drug therapy: Secondary | ICD-10-CM | POA: Insufficient documentation

## 2011-07-22 DIAGNOSIS — Z7982 Long term (current) use of aspirin: Secondary | ICD-10-CM | POA: Insufficient documentation

## 2011-07-22 LAB — DIFFERENTIAL
Basophils Absolute: 0 10*3/uL (ref 0.0–0.1)
Eosinophils Absolute: 0 10*3/uL (ref 0.0–0.7)
Eosinophils Relative: 0 % (ref 0–5)
Neutrophils Relative %: 84 % — ABNORMAL HIGH (ref 43–77)

## 2011-07-22 LAB — CBC
MCH: 28.1 pg (ref 26.0–34.0)
MCV: 86.7 fL (ref 78.0–100.0)
Platelets: 154 10*3/uL (ref 150–400)
RDW: 15.2 % (ref 11.5–15.5)
WBC: 9.9 10*3/uL (ref 4.0–10.5)

## 2011-07-22 LAB — COMPREHENSIVE METABOLIC PANEL
BUN: 22 mg/dL (ref 6–23)
Calcium: 9.6 mg/dL (ref 8.4–10.5)
Creatinine, Ser: 0.94 mg/dL (ref 0.50–1.10)
GFR calc Af Amer: 68 mL/min — ABNORMAL LOW (ref >90)
Glucose, Bld: 126 mg/dL — ABNORMAL HIGH (ref 70–99)
Total Protein: 6.9 g/dL (ref 6.0–8.3)

## 2011-07-22 LAB — APTT: aPTT: 47 seconds — ABNORMAL HIGH (ref 24–37)

## 2011-07-22 NOTE — ED Notes (Signed)
RUE:AV40<JW> Expected date:<BR> Expected time: 9:29 PM<BR> Means of arrival:<BR> Comments:<BR> M251 - 75yoF Constipation, no BM x48hrs

## 2011-07-22 NOTE — ED Notes (Addendum)
Pt takes two stool softeners. She took one last night and had a little BM this evening. Pt had cataract surgery last Wednesday and doesn't do very well with anesthesia. Reports no pain medications taken. Still has normal nutrition intake. Last able to urinate 2200.

## 2011-07-22 NOTE — ED Notes (Signed)
Pt reports nosebleed while possibly due to straining earlier during BM.  Pt on coumadin

## 2011-07-22 NOTE — ED Provider Notes (Signed)
History     CSN: [id number]  Arrival date & time 07/22/11  2135   First MD Initiated Contact with Patient 07/22/11 2248      Chief Complaint  Patient presents with  . Abdominal Pain    (Consider location/radiation/quality/duration/timing/severity/associated sxs/prior treatment) HPI Comments: 76 year old female with a history of aortic valve replacement, hypertension and recent cataract surgery on the left eye. She presents with one week of constipation, hard small stools and intermittent cramping in the lower abdomen followed by one episode of nausea and vomiting at home and a subsequent loose runny stool. At this time she has no complaints, no pain, no nausea. Her symptoms are intermittent, crampy, nothing makes it better or worse, not associated with fevers chills back pain chest pain cough shortness of breath swelling rashes or bleeding. She is on Coumadin for her aortic valve replacement  Patient is a 76 y.o. female presenting with abdominal pain. The history is provided by the patient and a relative.  Abdominal Pain The primary symptoms of the illness include abdominal pain.    Past Medical History  Diagnosis Date  . Arthritis   . PONV (postoperative nausea and vomiting)     nausea  only  . Hypertension   . Aortic stenosis 2004    valve replacement    Past Surgical History  Procedure Date  . Cardiac valve replacement     2004  . Hand surgery     right  . Colonoscopy 2010?  Marland Kitchen Cardiac catheterization 2004  . Cataract extraction w/phaco 07/16/2011    Procedure: CATARACT EXTRACTION PHACO AND INTRAOCULAR LENS PLACEMENT (IOC);  Surgeon: Shade Flood, MD;  Location: [facility] OR;  Service: Ophthalmology;  Laterality: Left;    History reviewed. No pertinent family history.  History  Substance Use Topics  . Smoking status: Not on file  . Smokeless tobacco: Not on file  . Alcohol Use: No    OB History    Grav Para Term Preterm Abortions TAB SAB Ect Mult Living                  Review of Systems  Gastrointestinal: Positive for abdominal pain.  All other systems reviewed and are negative.    Allergies  Review of patient's allergies indicates no known allergies.  Home Medications   Current Outpatient Rx  Name Route Sig Dispense Refill  . ASPIRIN EC 81 MG PO TBEC Oral Take 81 mg by mouth daily.    Marland Kitchen CARVEDILOL 6.25 MG PO TABS Oral Take 6.25 mg by mouth daily.    Marland Kitchen LISINOPRIL 20 MG PO TABS Oral Take 20 mg by mouth daily.    Marland Kitchen ROPINIROLE HCL 0.5 MG PO TABS Oral Take 0.5 mg by mouth 2 (two) times daily as needed. For restless leg syndrome    . SIMVASTATIN 40 MG PO TABS Oral Take 40 mg by mouth daily.    . WARFARIN SODIUM 4 MG PO TABS Oral Take 4 mg by mouth daily.    . TRAMADOL HCL 50 MG PO TABS Oral Take 1 tablet (50 mg total) by mouth every 6 (six) hours as needed for pain. 15 tablet 0    BP 158/69  Pulse 63  Temp(Src) 98.3 F (36.8 C) (Oral)  Resp 15  Ht 5\' 2"  (1.575 m)  Wt 123 lb (55.792 kg)  BMI 22.50 kg/m2  SpO2 96%  Physical Exam  Nursing note and vitals reviewed. Constitutional: She appears well-developed and well-nourished. No distress.  HENT:  Head: Normocephalic  and atraumatic.  Mouth/Throat: Oropharynx is clear and moist. No oropharyngeal exudate.  Eyes: Conjunctivae and EOM are normal. Pupils are equal, round, and reactive to light. Right eye exhibits no discharge. Left eye exhibits no discharge. No scleral icterus.  Neck: Normal range of motion. Neck supple. No JVD present. No thyromegaly present.  Cardiovascular: Normal rate, regular rhythm and intact distal pulses.  Exam reveals no gallop and no friction rub.   Murmur ( soft systolic murmur) heard. Pulmonary/Chest: Effort normal and breath sounds normal. No respiratory distress. She has no wheezes. She has no rales.  Abdominal: Soft. Bowel sounds are normal. She exhibits no distension and no mass. There is no tenderness.  Musculoskeletal: Normal range of motion. She exhibits no  edema and no tenderness.  Lymphadenopathy:    She has no cervical adenopathy.  Neurological: She is alert. Coordination normal.  Skin: Skin is warm and dry. No rash noted. No erythema.  Psychiatric: She has a normal mood and affect. Her behavior is normal.    ED Course  Procedures (including critical care time)  Labs Reviewed  URINALYSIS, ROUTINE W REFLEX MICROSCOPIC - Abnormal; Notable for the following:    Hgb urine dipstick SMALL (*)    Leukocytes, UA SMALL (*)    All other components within normal limits  DIFFERENTIAL - Abnormal; Notable for the following:    Neutrophils Relative 84 (*)    Neutro Abs 8.3 (*)    Lymphocytes Relative 9 (*)    All other components within normal limits  APTT - Abnormal; Notable for the following:    aPTT 47 (*)    All other components within normal limits  PROTIME-INR - Abnormal; Notable for the following:    Prothrombin Time 31.5 (*)    INR 2.99 (*)    All other components within normal limits  COMPREHENSIVE METABOLIC PANEL - Abnormal; Notable for the following:    Glucose, Bld 126 (*)    GFR calc non Af Amer 59 (*)    GFR calc Af Amer 68 (*)    All other components within normal limits  CBC  URINE MICROSCOPIC-ADD ON   Dg Abd 1 View  07/22/2011  *RADIOLOGY REPORT*  Clinical Data: Constipation for the past week.  Now having diarrhea.  Intermittent abdominal cramping.  ABDOMEN - 1 VIEW  Comparison: No priors.  Findings: Gas and stool is seen scattered throughout the colon extending to the level of the distal rectum.  There are a few nondilated loops of gas-filled small bowel noted in the abdomen. No pathologic distension of small bowel is evident.  No gross evidence of pneumoperitoneum is identified on these supine images.  IMPRESSION: 1.  Non specific, nonobstructive bowel gas pattern. 2.  No pneumoperitoneum.  Original Report Authenticated By: Florencia Reasons, M.D.     1. Abdominal pain       MDM  At this time her vital signs are  normal other than some hypertension, abdomen is soft and nontender but her bowel sounds are increased. With the history of nausea vomiting and a watery stool prior to arrival this suggests an early gastrointestinal dysfunction possibly infectious. She declines any medications at this time, KUB and urinalysis pending, check coagulation panel secondary to patient's Coumadin  Labs reassuring, UA negative, xray without acute findings - pt overall appears well and wants to go home, return instructions given, suspect early GI illness.  Vida Roller, MD 07/23/11 [phone]

## 2011-07-22 NOTE — ED Notes (Signed)
Abdominal pain

## 2011-07-22 NOTE — ED Notes (Signed)
Pt. Notified that a UA was needed Pt. Stated that she would notify ED staff when she was able to void.

## 2011-07-22 NOTE — ED Notes (Signed)
Pt present to room 11 via ems with complaints of abdominal pain.  Pt rate pain 9/10 intermittently.  Pt had left eye surgery a week ago at . Per EMS, pt hx of side effects from anesthesia in past.  Pt had anesthesia for eye surgery a week ago and now c/o of similar side effects experienced in the past such as loss of appetite, n/v, and abd pain.  Pt states onset of abd pain x48 hrs ago with possible constipation.  Per ems, pt self administered laxatives and fleets enema and had o results.  Pt aaox3.  Pt ambulatory.  Pt husband at bedside

## 2011-07-23 LAB — URINALYSIS, ROUTINE W REFLEX MICROSCOPIC
Bilirubin Urine: NEGATIVE
Nitrite: NEGATIVE
Protein, ur: NEGATIVE mg/dL
Urobilinogen, UA: 1 mg/dL (ref 0.0–1.0)

## 2011-07-23 LAB — URINE MICROSCOPIC-ADD ON

## 2011-07-23 MED ORDER — TRAMADOL HCL 50 MG PO TABS: 50.0000 mg | ORAL_TABLET | Freq: Four times a day (QID) | ORAL | Status: AC | PRN

## 2011-07-23 NOTE — Discharge Instructions (Signed)
You have been diagnosed with undifferentiated abdominal pain.  Abdominal pain can be caused by many things. Your caregiver evaluates the seriousness of your pain by an examination and possibly blood or urine tests and imaging (CT scan, x-rays, ultrasound). Many cases can be observed and treated at home after initial evaluation in the emergency department. Even though you are being discharged home, abdominal pain can be unpredictable. Therefore, you need a repeat exam if your pain does not resolve, returns, or worsens. Most patient's with abdominal pain do not need to be admitted to the hospital or have surgery, but serious problems like appendicitis and gallbladder attacks can start out as nonspecific pain. Many abdominal conditions cannot be diagnosed in 1 visit, so followup evaluations are very important.  Seek immediate medical attention if:  *The pain does not go away or becomes severe. *Temperature above 101 develops *Repeated vomiting occurs(multiple episodes) *The pain becomes localized to portions of the abdomen. The right side could possibly be appendicitis. In an adult, the left lower portion of the abdomen could be colitis or diverticulitis. *Blood is being passed in stools or vomit *Return also if you develop chest pain, difficulty breathing, dizziness or fainting, or become confused poorly responsive or inconsolable (young children).     If you do not have a physician, you should reference the below phone numbers and call in the morning to establish follow up care.  RESOURCE GUIDE  Dental Problems  Patients with Medicaid: [facility]                     [facility] [address]                                           [address] Phone:  [phone]                                                  Phone:  [phone]  If unable to pay or uninsured, contact:  [facility] or [facility] to become qualified for the adult dental  clinic.  Chronic Pain Problems Contact [facility]  [phone] Patients need to be referred by their primary care doctor.  Insufficient Money for Medicine Contact [employer]:  call "211" or [facility] 271-5999.  No Primary Care Doctor Call [facility]  832-8000 Other agencies that provide inexpensive medical care    [facility]  832-8035    [facility]  [phone]    [facility]  [phone]    Women's Clinic  832-4777    [facility]  [phone]    [facility]  [phone]  Psychological Services [facility]  832-9600 [facility]  [phone] [facility]   800 853-5163 (emergency services 641-4993)  Substance Abuse Resources [facility]  336-882-2125 [facility] 336-784-9470 [facility] 336-285-9073 [facility] 336-845-3988 Residential & Outpatient Substance Abuse Program  800-659-3381  Abuse/Neglect [place] Child Abuse Hotline (336) 641-3795 [place] Child Abuse Hotline 800-378-5315 (After Hours)  Emergency Shelter [facility] (336) 271-5985  M[facility] (336) 275-9566 [facility]   Services (704) 372-4663  MRSA Hotline #:   832-7006    [place] Resources  [facility] [place]     [employer]                          [facility] [address] [place]                       [address]      [address]  [place]                                                [place]                            [place] Phone:  [phone]                                   Phone:  [phone]                 Phone:  [phone]  [facility] Phone:  342-8316  [place] Child Abuse Hotline (336) 342-1394 (336) 342-3537 (After Hours)    

## 2011-08-15 ENCOUNTER — Other Ambulatory Visit: Payer: Self-pay | Admitting: Cardiology

## 2011-10-16 ENCOUNTER — Other Ambulatory Visit: Payer: Self-pay | Admitting: Cardiology

## 2012-05-25 ENCOUNTER — Other Ambulatory Visit: Payer: Self-pay

## 2012-05-25 DIAGNOSIS — Z1231 Encounter for screening mammogram for malignant neoplasm of breast: Secondary | ICD-10-CM

## 2012-06-21 ENCOUNTER — Ambulatory Visit
Admission: RE | Admit: 2012-06-21 | Discharge: 2012-06-21 | Disposition: A | Discharge status: Home / Self Care | Payer: Medicare Other | Source: Ambulatory Visit

## 2012-06-21 DIAGNOSIS — Z1231 Encounter for screening mammogram for malignant neoplasm of breast: Secondary | ICD-10-CM

## 2013-05-30 ENCOUNTER — Other Ambulatory Visit: Payer: Self-pay

## 2013-05-30 DIAGNOSIS — Z1231 Encounter for screening mammogram for malignant neoplasm of breast: Secondary | ICD-10-CM

## 2013-07-12 ENCOUNTER — Ambulatory Visit
Admission: RE | Admit: 2013-07-12 | Discharge: 2013-07-12 | Disposition: A | Discharge status: Home / Self Care | Payer: Medicare Other | Source: Ambulatory Visit

## 2013-07-12 DIAGNOSIS — Z1231 Encounter for screening mammogram for malignant neoplasm of breast: Secondary | ICD-10-CM

## 2013-07-13 ENCOUNTER — Ambulatory Visit: Payer: BC Managed Care – PPO

## 2014-06-26 ENCOUNTER — Other Ambulatory Visit: Payer: Self-pay

## 2014-06-26 DIAGNOSIS — Z1231 Encounter for screening mammogram for malignant neoplasm of breast: Secondary | ICD-10-CM

## 2014-07-14 ENCOUNTER — Ambulatory Visit
Admission: RE | Admit: 2014-07-14 | Discharge: 2014-07-14 | Disposition: A | Discharge status: Home / Self Care | Payer: Medicare Other | Source: Ambulatory Visit

## 2014-07-14 DIAGNOSIS — Z1231 Encounter for screening mammogram for malignant neoplasm of breast: Secondary | ICD-10-CM

## 2014-07-15 ENCOUNTER — Inpatient Hospital Stay (HOSPITAL_COMMUNITY)
Admission: EM | Admit: 2014-07-15 | Discharge: 2014-07-16 | DRG: 311 | Disposition: A | Discharge status: Home / Self Care | Payer: Medicare Other | Attending: Cardiovascular Disease | Admitting: Cardiovascular Disease

## 2014-07-15 ENCOUNTER — Encounter (HOSPITAL_COMMUNITY): Payer: Self-pay | Admitting: *Deleted

## 2014-07-15 ENCOUNTER — Other Ambulatory Visit (HOSPITAL_COMMUNITY): Payer: Self-pay

## 2014-07-15 ENCOUNTER — Emergency Department (HOSPITAL_COMMUNITY): Payer: Medicare Other

## 2014-07-15 DIAGNOSIS — Z79899 Other long term (current) drug therapy: Secondary | ICD-10-CM

## 2014-07-15 DIAGNOSIS — Z952 Presence of prosthetic heart valve: Secondary | ICD-10-CM

## 2014-07-15 DIAGNOSIS — Z7901 Long term (current) use of anticoagulants: Secondary | ICD-10-CM | POA: Diagnosis not present

## 2014-07-15 DIAGNOSIS — I249 Acute ischemic heart disease, unspecified: Principal | ICD-10-CM | POA: Diagnosis present

## 2014-07-15 DIAGNOSIS — Z7982 Long term (current) use of aspirin: Secondary | ICD-10-CM | POA: Diagnosis not present

## 2014-07-15 DIAGNOSIS — Z951 Presence of aortocoronary bypass graft: Secondary | ICD-10-CM | POA: Diagnosis not present

## 2014-07-15 DIAGNOSIS — I10 Essential (primary) hypertension: Secondary | ICD-10-CM | POA: Diagnosis present

## 2014-07-15 DIAGNOSIS — M1990 Unspecified osteoarthritis, unspecified site: Secondary | ICD-10-CM | POA: Diagnosis present

## 2014-07-15 DIAGNOSIS — I2510 Atherosclerotic heart disease of native coronary artery without angina pectoris: Secondary | ICD-10-CM | POA: Diagnosis present

## 2014-07-15 DIAGNOSIS — R079 Chest pain, unspecified: Secondary | ICD-10-CM

## 2014-07-15 LAB — CBC
HEMATOCRIT: 38.6 % (ref 36.0–46.0)
Hemoglobin: 12.6 g/dL (ref 12.0–15.0)
MCH: 30.7 pg (ref 26.0–34.0)
MCHC: 32.6 g/dL (ref 30.0–36.0)
MCV: 93.9 fL (ref 78.0–100.0)
Platelets: 132 10*3/uL — ABNORMAL LOW (ref 150–400)
RBC: 4.11 MIL/uL (ref 3.87–5.11)
RDW: 13 % (ref 11.5–15.5)
WBC: 5.3 10*3/uL (ref 4.0–10.5)

## 2014-07-15 LAB — COMPREHENSIVE METABOLIC PANEL
ALT: 14 U/L (ref 14–54)
ANION GAP: 8 (ref 5–15)
AST: 25 U/L (ref 15–41)
Albumin: 3.9 g/dL (ref 3.5–5.0)
Alkaline Phosphatase: 98 U/L (ref 38–126)
BUN: 19 mg/dL (ref 6–20)
CALCIUM: 9 mg/dL (ref 8.9–10.3)
CHLORIDE: 104 mmol/L (ref 101–111)
CO2: 28 mmol/L (ref 22–32)
CREATININE: 0.99 mg/dL (ref 0.44–1.00)
GFR calc Af Amer: >60 mL/min (ref >60)
GFR calc non Af Amer: 54 mL/min — ABNORMAL LOW (ref >60)
Glucose, Bld: 97 mg/dL (ref 65–99)
Potassium: 4 mmol/L (ref 3.5–5.1)
Sodium: 140 mmol/L (ref 135–145)
Total Bilirubin: 0.6 mg/dL (ref 0.3–1.2)
Total Protein: 7.3 g/dL (ref 6.5–8.1)

## 2014-07-15 LAB — I-STAT TROPONIN, ED: TROPONIN I, POC: 0 ng/mL (ref 0.00–0.08)

## 2014-07-15 LAB — PROTIME-INR
INR: 2.91 — ABNORMAL HIGH (ref 0.00–1.49)
Prothrombin Time: 29.9 seconds — ABNORMAL HIGH (ref 11.6–15.2)

## 2014-07-15 MED ORDER — SODIUM CHLORIDE 0.9 % IJ SOLN
3.0000 mL | Freq: Two times a day (BID) | INTRAMUSCULAR | Status: DC
  Administered 2014-07-15 – 2014-07-16 (×2): 3 mL via INTRAVENOUS

## 2014-07-15 MED ORDER — SODIUM CHLORIDE 0.9 % IV SOLN
250.0000 mL | INTRAVENOUS | Status: DC | PRN
Start: 2014-07-15 — End: 2014-07-16

## 2014-07-15 MED ORDER — SIMVASTATIN 40 MG PO TABS
40.0000 mg | ORAL_TABLET | Freq: Every day | ORAL | Status: DC
  Administered 2014-07-15: 40 mg via ORAL
  Filled 2014-07-15 (×2): qty 1

## 2014-07-15 MED ORDER — AMLODIPINE BESYLATE 5 MG PO TABS
5.0000 mg | ORAL_TABLET | Freq: Every day | ORAL | Status: DC
  Administered 2014-07-15 – 2014-07-16 (×2): 5 mg via ORAL
  Filled 2014-07-15 (×2): qty 1

## 2014-07-15 MED ORDER — VITAMIN B-12 1000 MCG PO TABS
1000.0000 ug | ORAL_TABLET | Freq: Two times a day (BID) | ORAL | Status: DC
  Administered 2014-07-15 – 2014-07-16 (×2): 1000 ug via ORAL
  Filled 2014-07-15 (×3): qty 1

## 2014-07-15 MED ORDER — ASPIRIN 300 MG RE SUPP
300.0000 mg | RECTAL | Status: AC
  Filled 2014-07-15: qty 1

## 2014-07-15 MED ORDER — AMLODIPINE BESYLATE 5 MG PO TABS
5.0000 mg | ORAL_TABLET | Freq: Every day | ORAL | Status: DC
  Filled 2014-07-15: qty 1

## 2014-07-15 MED ORDER — ONDANSETRON HCL 4 MG/2ML IJ SOLN: 4.0000 mg | Freq: Four times a day (QID) | INTRAMUSCULAR | Status: DC | PRN

## 2014-07-15 MED ORDER — SODIUM CHLORIDE 0.9 % IJ SOLN: 3.0000 mL | INTRAMUSCULAR | Status: DC | PRN

## 2014-07-15 MED ORDER — ASPIRIN EC 81 MG PO TBEC
81.0000 mg | DELAYED_RELEASE_TABLET | Freq: Every day | ORAL | Status: DC
  Administered 2014-07-16: 81 mg via ORAL
  Filled 2014-07-15: qty 1

## 2014-07-15 MED ORDER — ACETAMINOPHEN 325 MG PO TABS: 650.0000 mg | ORAL_TABLET | ORAL | Status: DC | PRN

## 2014-07-15 MED ORDER — CARVEDILOL 3.125 MG PO TABS
3.1250 mg | ORAL_TABLET | Freq: Two times a day (BID) | ORAL | Status: DC
  Administered 2014-07-15 – 2014-07-16 (×2): 3.125 mg via ORAL
  Filled 2014-07-15 (×4): qty 1

## 2014-07-15 MED ORDER — WARFARIN SODIUM 4 MG PO TABS
4.0000 mg | ORAL_TABLET | Freq: Every day | ORAL | Status: DC
  Administered 2014-07-15: 4 mg via ORAL
  Filled 2014-07-15 (×2): qty 1

## 2014-07-15 MED ORDER — LISINOPRIL 20 MG PO TABS
20.0000 mg | ORAL_TABLET | Freq: Two times a day (BID) | ORAL | Status: DC
  Administered 2014-07-15 – 2014-07-16 (×2): 20 mg via ORAL
  Filled 2014-07-15 (×3): qty 1

## 2014-07-15 MED ORDER — WARFARIN - PHYSICIAN DOSING INPATIENT: Freq: Every day | Status: DC

## 2014-07-15 MED ORDER — NITROGLYCERIN 0.4 MG SL SUBL: 0.4000 mg | SUBLINGUAL_TABLET | SUBLINGUAL | Status: DC | PRN

## 2014-07-15 MED ORDER — ASPIRIN 81 MG PO CHEW
324.0000 mg | CHEWABLE_TABLET | ORAL | Status: AC
  Administered 2014-07-15: 324 mg via ORAL
  Filled 2014-07-15: qty 4

## 2014-07-15 MED ORDER — ROPINIROLE HCL 0.5 MG PO TABS
0.5000 mg | ORAL_TABLET | Freq: Every day | ORAL | Status: DC
  Administered 2014-07-15: 0.5 mg via ORAL
  Filled 2014-07-15 (×2): qty 1

## 2014-07-15 NOTE — ED Provider Notes (Signed)
Medical screening examination/treatment/procedure(s) were conducted as a shared visit with non-physician practitioner(s) and myself.  I personally evaluated the patient during the encounter.   EKG Interpretation   Date/Time:  Saturday Jul 15 2014 14:48:36 EDT Ventricular Rate:  53 PR Interval:  156 QRS Duration: 98 QT Interval:  460 QTC Calculation: 431 R Axis:   32 Text Interpretation:  Sinus bradycardia Otherwise normal ECG No  significant change since last tracing Confirmed by Martavia Tye  MD, Morgaine Kimball  [phone] on 07/15/2014 2:59:40 PM      Results for orders placed or performed during the hospital encounter of 07/15/14  CBC  Result Value Ref Range   WBC 5.3 4.0 - 10.5 K/uL   RBC 4.11 3.87 - 5.11 MIL/uL   Hemoglobin 12.6 12.0 - 15.0 g/dL   HCT 96.238.6 95.236.0 - 84.146.0 %   MCV 93.9 78.0 - 100.0 fL   MCH 30.7 26.0 - 34.0 pg   MCHC 32.6 30.0 - 36.0 g/dL   RDW 32.413.0 40.111.5 - 02.715.5 %   Platelets 132 (L) 150 - 400 K/uL  Protime-INR (if pt is taking Coumadin)  Result Value Ref Range   Prothrombin Time 29.9 (H) 11.6 - 15.2 seconds   INR 2.91 (H) 0.00 - 1.49  Comprehensive metabolic panel  Result Value Ref Range   Sodium 140 135 - 145 mmol/L   Potassium 4.0 3.5 - 5.1 mmol/L   Chloride 104 101 - 111 mmol/L   CO2 28 22 - 32 mmol/L   Glucose, Bld 97 65 - 99 mg/dL   BUN 19 6 - 20 mg/dL   Creatinine, Ser 2.530.99 0.44 - 1.00 mg/dL   Calcium 9.0 8.9 - 66.410.3 mg/dL   Total Protein 7.3 6.5 - 8.1 g/dL   Albumin 3.9 3.5 - 5.0 g/dL   AST 25 15 - 41 U/L   ALT 14 14 - 54 U/L   Alkaline Phosphatase 98 38 - 126 U/L   Total Bilirubin 0.6 0.3 - 1.2 mg/dL   GFR calc non Af Amer 54 (L) >60 mL/min   GFR calc Af Amer >60 >60 mL/min   Anion gap 8 5 - 15  I-stat troponin, ED  ([facility])  Result Value Ref Range   Troponin i, poc 0.00 0.00 - 0.08 ng/mL   Comment 3           Dg Chest 2 View  07/15/2014   CLINICAL DATA:  Chest pain onset yesterday. History of hypertension and heart valve replacement.   EXAM: CHEST  2 VIEW  COMPARISON:  07/16/2011  FINDINGS: Cardiac silhouette is mildly enlarged. Changes from cardiac surgery and aortic valve replacement are stable. No mediastinal or hilar masses or evidence of adenopathy.  Lungs are mildly hyperexpanded. No lung consolidation or edema. Stable pleural parenchymal scarring at the apices. No pleural effusion or pneumothorax.  Bony thorax is demineralized but grossly intact.  IMPRESSION: No acute cardiopulmonary disease.   Electronically Signed   By: Amie Portlandavid  Ormond M.D.   On: 07/15/2014 15:42   Mm Digital Screening Bilateral  07/14/2014   CLINICAL DATA:  Screening.  EXAM: DIGITAL SCREENING BILATERAL MAMMOGRAM WITH CAD  COMPARISON:  Previous exam(s).  ACR Breast Density Category c: The breast tissue is heterogeneously dense, which may obscure small masses.  FINDINGS: There are no findings suspicious for malignancy. Images were processed with CAD.  IMPRESSION: No mammographic evidence of malignancy. A result letter of this screening mammogram will be mailed directly to the patient.  RECOMMENDATION: Screening mammogram  in one year. (Code:SM-B-01Y)  BI-RADS CATEGORY  1: Negative.   Electronically Signed   By: Hulan Saas M.D.   On: 07/14/2014 14:02    Patient will be seen by Dr. Jodelle Green covering for [facility], patient with intermittent chest pain only lasting a few minutes no chest pain lasting 15 or 20 minutes. Patient's labs without significant abnormalities. Cardiology will determine whether patient will be admitted or can be discharged home.   Patient's chest x-rays negative troponins negative EKG without acute changes. Patient's heart regular rate and rhythm lungs are clear bilaterally. Patient is alert and oriented.  Vanetta Mulders, MD 07/15/14 [phone]

## 2014-07-15 NOTE — ED Notes (Signed)
Pt. Is back in room. 

## 2014-07-15 NOTE — ED Notes (Signed)
Pt. Is going to x-ray. 

## 2014-07-15 NOTE — Progress Notes (Signed)
Patient has increased BP.  Taken 3x.  Manually taken last time and result is 212/92.  Dr. Algie CofferKadakia phoned.  Amlodipine 5mg  PO daily started per MD.

## 2014-07-15 NOTE — H&P (Addendum)
Referring Physician: Dr. Charolette Forward.  PCP: Dr. Tamsen Roers.   Monica Wolf is an 79 y.o. female.                       Chief Complaint: Chest pain  HPI: 68 year white female has 2 days history of intermittent, left sided, non-exertional chest pain lasting for few minutes. No sweating spell, nausea or radiation of pain. Past history significant for prosthetic AV in 2004 and hypertension.  Past Medical History  Diagnosis Date  . Arthritis   . PONV (postoperative nausea and vomiting)     nausea  only  . Hypertension   . Aortic stenosis 2004    valve replacement      Past Surgical History  Procedure Laterality Date  . Cardiac valve replacement      2004  . Hand surgery      right  . Colonoscopy  2010?  Marland Kitchen Cardiac catheterization  2004  . Cataract extraction w/phaco  07/16/2011    Procedure: CATARACT EXTRACTION PHACO AND INTRAOCULAR LENS PLACEMENT (IOC);  Surgeon: Adonis Brook, MD;  Location: [facility];  Service: Ophthalmology;  Laterality: Left;    No family history on file. Social History:  reports that she has never smoked. She does not have any smokeless tobacco history on file. She reports that she does not drink alcohol or use illicit drugs.  Allergies:  Allergies  Allergen Reactions  . Codeine Nausea And Vomiting    Severe nausea     (Not in a hospital admission)  Results for orders placed or performed during the hospital encounter of 07/15/14 (from the past 48 hour(s))  CBC     Status: Abnormal   Collection Time: 07/15/14  3:02 PM  Result Value Ref Range   WBC 5.3 4.0 - 10.5 K/uL   RBC 4.11 3.87 - 5.11 MIL/uL   Hemoglobin 12.6 12.0 - 15.0 g/dL   HCT 38.6 36.0 - 46.0 %   MCV 93.9 78.0 - 100.0 fL   MCH 30.7 26.0 - 34.0 pg   MCHC 32.6 30.0 - 36.0 g/dL   RDW 13.0 11.5 - 15.5 %   Platelets 132 (L) 150 - 400 K/uL  Protime-INR (if pt is taking Coumadin)     Status: Abnormal   Collection Time: 07/15/14  3:02 PM  Result Value Ref Range   Prothrombin Time  29.9 (H) 11.6 - 15.2 seconds   INR 2.91 (H) 0.00 - 1.49  Comprehensive metabolic panel     Status: Abnormal   Collection Time: 07/15/14  3:02 PM  Result Value Ref Range   Sodium 140 135 - 145 mmol/L   Potassium 4.0 3.5 - 5.1 mmol/L   Chloride 104 101 - 111 mmol/L   CO2 28 22 - 32 mmol/L   Glucose, Bld 97 65 - 99 mg/dL   BUN 19 6 - 20 mg/dL   Creatinine, Ser 0.99 0.44 - 1.00 mg/dL   Calcium 9.0 8.9 - 10.3 mg/dL   Total Protein 7.3 6.5 - 8.1 g/dL   Albumin 3.9 3.5 - 5.0 g/dL   AST 25 15 - 41 U/L   ALT 14 14 - 54 U/L   Alkaline Phosphatase 98 38 - 126 U/L   Total Bilirubin 0.6 0.3 - 1.2 mg/dL   GFR calc non Af Amer 54 (L) >60 mL/min   GFR calc Af Amer >60 >60 mL/min    Comment: (NOTE) The eGFR has been calculated using the CKD EPI equation.  This calculation has not been validated in all clinical situations. eGFR's persistently <60 mL/min signify possible Chronic Kidney Disease.    Anion gap 8 5 - 15  I-stat troponin, ED  (not at [facility], [facility])     Status: None   Collection Time: 07/15/14  3:08 PM  Result Value Ref Range   Troponin i, poc 0.00 0.00 - 0.08 ng/mL   Comment 3            Comment: Due to the release kinetics of cTnI, a negative result within the first hours of the onset of symptoms does not rule out myocardial infarction with certainty. If myocardial infarction is still suspected, repeat the test at appropriate intervals.    Dg Chest 2 View  07/15/2014   CLINICAL DATA:  Chest pain onset yesterday. History of hypertension and heart valve replacement.  EXAM: CHEST  2 VIEW  COMPARISON:  07/16/2011  FINDINGS: Cardiac silhouette is mildly enlarged. Changes from cardiac surgery and aortic valve replacement are stable. No mediastinal or hilar masses or evidence of adenopathy.  Lungs are mildly hyperexpanded. No lung consolidation or edema. Stable pleural parenchymal scarring at the apices. No pleural effusion or pneumothorax.  Bony thorax is demineralized but grossly intact.   IMPRESSION: No acute cardiopulmonary disease.   Electronically Signed   By: Lajean Manes M.D.   On: 07/15/2014 15:42   Mm Digital Screening Bilateral  07/14/2014   CLINICAL DATA:  Screening.  EXAM: DIGITAL SCREENING BILATERAL MAMMOGRAM WITH CAD  COMPARISON:  Previous exam(s).  ACR Breast Density Category c: The breast tissue is heterogeneously dense, which may obscure small masses.  FINDINGS: There are no findings suspicious for malignancy. Images were processed with CAD.  IMPRESSION: No mammographic evidence of malignancy. A result letter of this screening mammogram will be mailed directly to the patient.  RECOMMENDATION: Screening mammogram in one year. (Code:SM-B-01Y)  BI-RADS CATEGORY  1: Negative.   Electronically Signed   By: Evangeline Dakin M.D.   On: 07/14/2014 14:02    Review Of Systems No SOB, LE swelling, N/V/D, weakness, confusion or diaphoresis. No GI or GU bleed. No kidney stone, No stroke, seizures or psych admission. Positive arthritis, cataract surgery, AV surgery and CABG.  Blood pressure 177/63, pulse 54, temperature 98.1 F (36.7 C), temperature source Oral, resp. rate 22, height $RemoveBe'5\' 4"'XHNHooYzp$  (1.626 m), weight 56.7 kg (125 lb), SpO2 99 %.  Physical Exam  Constitutional: She appears well-developed and well-nourished. No distress.  HENT: Normocephalic and atraumatic. Eyes: Conj-pink, Sclera-white, Pupils are equal, round, and reactive to light.  Neck: Normal range of motion. Neck supple. No JVD Cardiovascular: Normal rate and regular rhythm.Normal S1, Loud S2. II/VI systolic murmur.  Pulmonary/Chest: Effort normal and breath sounds normal. She exhibits no tenderness on palpation.  Abdominal: Soft. Bowel sounds are normal.  Musculoskeletal: No LE swelling, cyanosis or clubbing.  Neurological: She is alert. Moves all 4 extremities. Skin: Skin is warm and dry.  Psychiatric: She has a normal mood and affect.  Nursing note and vitals reviewed.  Assessment/Plan Acute coronary  syndrome CAD Prosthetic AV One vessel CABG  Admit/r/o MI/Nuclear stress test in AM.  Birdie Riddle, MD  07/15/2014, 5:23 PM

## 2014-07-15 NOTE — ED Provider Notes (Signed)
CSN: [id number]     Arrival date & time 07/15/14  1439 History   First MD Initiated Contact with Patient 07/15/14 1449     Chief Complaint  Patient presents with  . Chest Pain     (Consider location/radiation/quality/duration/timing/severity/associated sxs/prior Treatment) HPI    PCP: LITTLE,JAMES, MD Blood pressure 161/59, pulse 53, temperature 98.1 F (36.7 C), temperature source Oral, resp. rate 18, height 5\' 4"  (1.626 m), weight 125 lb (56.7 kg), SpO2 97 %.  Monica Wolf is a 79 y.o.female with a significant PMH of arthritis, PONV, hypertension, aortic stenosis presents to the ER with complaints of chest pains for the past two days. She has been seeing Dr. Algie CofferKadakia for the past 15 years and has had mitral valve replacement. SHe has not had chest pains ever before. Starting yesterday she developed on again off again chest pain that is to her left chest. It lasts 1-2 minutes had a time and is intermittent. No associated symptoms with this or radiation. Denies any illicit ing or relieving symptoms. She currently denies any pain or having any symptoms.  Negative Review of Symptoms: SOB, LE swelling, N/V/D, weakness, confusion or diaphoresis.   Past Medical History  Diagnosis Date  . Arthritis   . PONV (postoperative nausea and vomiting)     nausea  only  . Hypertension   . Aortic stenosis 2004    valve replacement   Past Surgical History  Procedure Laterality Date  . Cardiac valve replacement      2004  . Hand surgery      right  . Colonoscopy  2010?  Marland Kitchen. Cardiac catheterization  2004  . Cataract extraction w/phaco  07/16/2011    Procedure: CATARACT EXTRACTION PHACO AND INTRAOCULAR LENS PLACEMENT (IOC);  Surgeon: Shade FloodGreer Geiger, MD;  [facility];  Service: Ophthalmology;  Laterality: Left;   No family history on file. History  Substance Use Topics  . Smoking status: Never Smoker   . Smokeless tobacco: Not on file  . Alcohol Use: No   OB History    No data  available     Review of Systems  10 Systems reviewed and are negative for acute change except as noted in the HPI.   Allergies  Codeine  Home Medications   Prior to Admission medications   Medication Sig Start Date End Date Taking? Authorizing Provider  aspirin EC 81 MG tablet Take 81 mg by mouth daily.   Yes Historical Provider, MD  carvedilol (COREG) 3.125 MG tablet Take 3.125 mg by mouth 2 (two) times daily with a meal.  07/10/14  Yes Historical Provider, MD  Cholecalciferol (VITAMIN D) 2000 UNITS tablet Take 2,000 Units by mouth daily.   Yes Historical Provider, MD  lisinopril (PRINIVIL,ZESTRIL) 20 MG tablet Take 20 mg by mouth 2 (two) times daily.    Yes Historical Provider, MD  rOPINIRole (REQUIP) 0.5 MG tablet Take 0.5 mg by mouth See admin instructions. Take 1 tablet (0.5 mg) daily at bedtime, may also take an additional tablet during the day as needed for restless leg syndrome   Yes Historical Provider, MD  simvastatin (ZOCOR) 40 MG tablet Take 40 mg by mouth at bedtime.    Yes Historical Provider, MD  vitamin B-12 (CYANOCOBALAMIN) 1000 MCG tablet Take 1,000 mcg by mouth 2 (two) times daily.   Yes Historical Provider, MD  warfarin (COUMADIN) 4 MG tablet Take 4 mg by mouth daily with supper.    Yes Historical Provider, MD   BP 161/59  mmHg  Pulse 53  Temp(Src) 98.1 F (36.7 C) (Oral)  Resp 18  Ht  (1.626 m)  Wt 125 lb (56.7 kg)  BMI 21.45 kg/m2  SpO2 97% Physical Exam  Constitutional: She appears well-developed and well-nourished. No distress.  HENT:  Head: Normocephalic and atraumatic.  Eyes: Pupils are equal, round, and reactive to light.  Neck: Normal range of motion. Neck supple.  Cardiovascular: Normal rate and regular rhythm.   Pulmonary/Chest: Effort normal and breath sounds normal. She has no decreased breath sounds. She has no wheezes. She has no rhonchi. She exhibits no tenderness and no bony tenderness.  Abdominal: Soft. Bowel sounds are normal.   Musculoskeletal:  No LE swelling  Neurological: She is alert.  Skin: Skin is warm and dry.  Psychiatric: She has a normal mood and affect.  Nursing note and vitals reviewed.   ED Course  Procedures (including critical care time) Labs Review Labs Reviewed  CBC - Abnormal; Notable for the following:    Platelets 132 (*)    All other components within normal limits  PROTIME-INR - Abnormal; Notable for the following:    Prothrombin Time 29.9 (*)    INR 2.91 (*)    All other components within normal limits  COMPREHENSIVE METABOLIC PANEL - Abnormal; Notable for the following:    GFR calc non Af Amer 54 (*)    All other components within normal limits  Rosezena Sensor, ED    Imaging Review Dg Chest 2 View  07/15/2014   CLINICAL DATA:  Chest pain onset yesterday. History of hypertension and heart valve replacement.  EXAM: CHEST  2 VIEW  COMPARISON:  07/16/2011  FINDINGS: Cardiac silhouette is mildly enlarged. Changes from cardiac surgery and aortic valve replacement are stable. No mediastinal or hilar masses or evidence of adenopathy.  Lungs are mildly hyperexpanded. No lung consolidation or edema. Stable pleural parenchymal scarring at the apices. No pleural effusion or pneumothorax.  Bony thorax is demineralized but grossly intact.  IMPRESSION: No acute cardiopulmonary disease.   Electronically Signed   By: Amie Portland M.D.   On: 07/15/2014 15:42   Mm Digital Screening Bilateral  07/14/2014   CLINICAL DATA:  Screening.  EXAM: DIGITAL SCREENING BILATERAL MAMMOGRAM WITH CAD  COMPARISON:  Previous exam(s).  ACR Breast Density Category c: The breast tissue is heterogeneously dense, which may obscure small masses.  FINDINGS: There are no findings suspicious for malignancy. Images were processed with CAD.  IMPRESSION: No mammographic evidence of malignancy. A result letter of this screening mammogram will be mailed directly to the patient.  RECOMMENDATION: Screening mammogram in one year.  (Code:SM-B-01Y)  BI-RADS CATEGORY  1: Negative.   Electronically Signed   By: Hulan Saas M.D.   On: 07/14/2014 14:02     EKG Interpretation   Date/Time:  Saturday Jul 15 2014 14:48:36 EDT Ventricular Rate:  53 PR Interval:  156 QRS Duration: 98 QT Interval:  460 QTC Calculation: 431 R Axis:   32 Text Interpretation:  Sinus bradycardia Otherwise normal ECG No  significant change since last tracing Confirmed by ZACKOWSKI  MD, SCOTT  [phone]) on 07/15/2014 2:59:40 PM      MDM   Final diagnoses:  Chest pain, unspecified chest pain type    Patient has a non acute EKG, negative trop, PT/INR within therapeutic range, CBC and CMP are unremarkable. No significant chest xray findings. She is on coumadin and she continues to have no pain.  I spoke with Dr. Algie Coffer who has  agreed to come see patient in the ER for further eval, he suspects admission for cardiac work-up. Dr. Deretha Emory has seen patient as well and is aware of plan.  Filed Vitals:   07/15/14 1630  BP: 161/59  Pulse: 53  Temp:   Resp: [address] PA-C 07/15/14 1704

## 2014-07-15 NOTE — ED Notes (Signed)
Pt is here with left sided chest pain that started yesterday and has been intermittent.  Has valve replacement and is on coumadin.

## 2014-07-16 ENCOUNTER — Inpatient Hospital Stay (HOSPITAL_COMMUNITY): Payer: Medicare Other

## 2014-07-16 LAB — BASIC METABOLIC PANEL
ANION GAP: 5 (ref 5–15)
BUN: 17 mg/dL (ref 6–20)
CALCIUM: 8.9 mg/dL (ref 8.9–10.3)
CHLORIDE: 106 mmol/L (ref 101–111)
CO2: 29 mmol/L (ref 22–32)
Creatinine, Ser: 0.96 mg/dL (ref 0.44–1.00)
GFR calc non Af Amer: 56 mL/min — ABNORMAL LOW (ref >60)
Glucose, Bld: 93 mg/dL (ref 65–99)
POTASSIUM: 3.8 mmol/L (ref 3.5–5.1)
Sodium: 140 mmol/L (ref 135–145)

## 2014-07-16 LAB — CBC
HCT: 37.4 % (ref 36.0–46.0)
HEMOGLOBIN: 12.4 g/dL (ref 12.0–15.0)
MCH: 31.2 pg (ref 26.0–34.0)
MCHC: 33.2 g/dL (ref 30.0–36.0)
MCV: 94.2 fL (ref 78.0–100.0)
Platelets: 120 10*3/uL — ABNORMAL LOW (ref 150–400)
RBC: 3.97 MIL/uL (ref 3.87–5.11)
RDW: 13 % (ref 11.5–15.5)
WBC: 4.3 10*3/uL (ref 4.0–10.5)

## 2014-07-16 LAB — PROTIME-INR
INR: 2.81 — ABNORMAL HIGH (ref 0.00–1.49)
PROTHROMBIN TIME: 29.2 s — AB (ref 11.6–15.2)

## 2014-07-16 LAB — LIPID PANEL
Cholesterol: 142 mg/dL (ref 0–200)
HDL: 46 mg/dL (ref >40)
LDL Cholesterol: 74 mg/dL (ref 0–99)
TRIGLYCERIDES: 108 mg/dL (ref <150)
Total CHOL/HDL Ratio: 3.1 RATIO
VLDL: 22 mg/dL (ref 0–40)

## 2014-07-16 LAB — TROPONIN I

## 2014-07-16 MED ORDER — REGADENOSON 0.4 MG/5ML IV SOLN
INTRAVENOUS | Status: AC
  Filled 2014-07-16: qty 5

## 2014-07-16 MED ORDER — NITROGLYCERIN 0.4 MG SL SUBL: 0.4000 mg | SUBLINGUAL_TABLET | SUBLINGUAL | Status: AC | PRN

## 2014-07-16 MED ORDER — ATORVASTATIN CALCIUM 20 MG PO TABS: 20.0000 mg | ORAL_TABLET | Freq: Every day | ORAL | Status: AC

## 2014-07-16 MED ORDER — TECHNETIUM TC 99M SESTAMIBI GENERIC - CARDIOLITE
10.0000 | Freq: Once | INTRAVENOUS | Status: AC | PRN
  Administered 2014-07-16: 10 via INTRAVENOUS

## 2014-07-16 MED ORDER — ATORVASTATIN CALCIUM 20 MG PO TABS
20.0000 mg | ORAL_TABLET | Freq: Every day | ORAL | Status: DC
  Filled 2014-07-16: qty 1

## 2014-07-16 MED ORDER — REGADENOSON 0.4 MG/5ML IV SOLN
0.4000 mg | Freq: Once | INTRAVENOUS | Status: AC
Start: 2014-07-16 — End: 2014-07-16
  Administered 2014-07-16: 0.4 mg via INTRAVENOUS
  Filled 2014-07-16: qty 5

## 2014-07-16 MED ORDER — AMLODIPINE BESYLATE 5 MG PO TABS: 5.0000 mg | ORAL_TABLET | Freq: Every day | ORAL | Status: AC

## 2014-07-16 MED ORDER — TECHNETIUM TC 99M SESTAMIBI - CARDIOLITE
30.0000 | Freq: Once | INTRAVENOUS | Status: AC | PRN
  Administered 2014-07-16: 30 via INTRAVENOUS

## 2014-07-16 NOTE — Discharge Summary (Signed)
Physician Discharge Summary  Patient ID: ISYS TIETJE MRN: [id number] DOB/AGE: [birth year] 79 y.o.  Admit date: 07/15/2014 Discharge date: 07/16/2014  Admission Diagnoses: Acute coronary syndrome CAD Prosthetic AV One vessel CABG  Discharge Diagnoses:  Principle Problem: * Chest pain * CAD H/O One vessel CABG H/O Prosthetic AV Chronic coumadin use  Discharged Condition: good  Hospital Course: 79 year white female has 2 days history of intermittent, left sided, non-exertional chest pain lasting for few minutes. No sweating spell, nausea or radiation of pain. Past history significant for prosthetic AV in 2004 and hypertension. She passed nuclear stress test with no reversible ischemia and 77 % EF. Her chest pain and blood pressures were controlled with amlodipine use. Her Simvastatin was changed to atorvastatin. She will be followed by Primary care in 2 weeks and Dr. Sharyn Lull in 1 month.  Consults: cardiology  Significant Diagnostic Studies: labs: Normal CBC, BMET, Troponin-I and Lipid panel. OPT/INR was 2.9.  EKG-Sinus rhythm with frequent premature ventricular beats.  Chest x-ray-unremarkable.  Nuclear medicine: Myocardial perfusion is normal. The study is normal. This is a low risk study. Overall left ventricular systolic function was normal. LV cavity size is normal. The left ventricular ejection fraction is hyperdynamic (>65%). A prior study was conducted on 11/03/2007.There are no significant changes in comparison to the prior study.   Treatments: cardiac meds: lisinopril (Zestril), aspirin, atorvastatin, carvedilol, amlodipine and warfarin.  Discharge Exam: Blood pressure 147/67, pulse 61, temperature 97.9 F (36.6 C), temperature source Oral, resp. rate 16, height  (1.626 m), weight 55.43 kg (122 lb 3.2 oz), SpO2 97 %. Physical Exam  Constitutional: She appears well-developed and well-nourished. No distress.  HENT: Normocephalic and atraumatic. Eyes: Conj-pink,  Sclera-white, Pupils are equal, round, and reactive to light.  Neck: Normal range of motion. Neck supple. No JVD Cardiovascular: Normal rate and regular rhythm.Normal S1, Loud S2. II/VI systolic murmur.  Pulmonary/Chest: Effort normal and breath sounds normal. She exhibits no tenderness on palpation.  Abdominal: Soft. Bowel sounds are normal.  Musculoskeletal: No LE swelling, cyanosis or clubbing.  Neurological: She is alert. Moves all 4 extremities. Skin: Skin is warm and dry.  Psychiatric: She has a normal mood and affect.  Disposition: 01-Home or Self Care     Medication List    STOP taking these medications        simvastatin 40 MG tablet  Commonly known as:  ZOCOR      TAKE these medications        amLODipine 5 MG tablet  Commonly known as:  NORVASC  Take 1 tablet (5 mg total) by mouth daily.     aspirin EC 81 MG tablet  Take 81 mg by mouth daily.     atorvastatin 20 MG tablet  Commonly known as:  LIPITOR  Take 1 tablet (20 mg total) by mouth daily at 6 PM.     carvedilol 3.125 MG tablet  Commonly known as:  COREG  Take 3.125 mg by mouth 2 (two) times daily with a meal.     lisinopril 20 MG tablet  Commonly known as:  PRINIVIL,ZESTRIL  Take 20 mg by mouth 2 (two) times daily.     nitroGLYCERIN 0.4 MG SL tablet  Commonly known as:  NITROSTAT  Place 1 tablet (0.4 mg total) under the tongue every 5 (five) minutes x 3 doses as needed for chest pain.     rOPINIRole 0.5 MG tablet  Commonly known as:  REQUIP  Take 0.5 mg by mouth  See admin instructions. Take 1 tablet (0.5 mg) daily at bedtime, may also take an additional tablet during the day as needed for restless leg syndrome     vitamin B-12 1000 MCG tablet  Commonly known as:  CYANOCOBALAMIN  Take 1,000 mcg by mouth 2 (two) times daily.     Vitamin D 2000 UNITS tablet  Take 2,000 Units by mouth daily.     warfarin 4 MG tablet  Commonly known as:  COUMADIN  Take 4 mg by mouth daily with supper.            Follow-up Information    Follow up with LITTLE,JAMES, MD. Schedule an appointment as soon as possible for a visit in 2 weeks.   Specialty:  Family Medicine   Contact information:   [address] [place] [address] [phone]       Follow up with Rinaldo CloudHarwani, Mohan, MD. Schedule an appointment as soon as possible for a visit in 1 month.   Specialty:  Cardiology   Contact information:   [address] [place] [address] [phone]       Signed: Ricki RodriguezKADAKIA,Tocarra Gassen S 07/16/2014, 1:23 PM

## 2014-07-21 LAB — NM MYOCAR MULTI W/SPECT W/WALL MOTION / EF
CHL CUP NUCLEAR SRS: 7
CHL CUP RESTING HR STRESS: 59 {beats}/min
LHR: 0.49
LV dias vol: 74 mL
LV sys vol: 24 mL
Nuc Stress EF: 67 %
SDS: 2
SSS: 9
TID: 0.93

## 2015-04-08 ENCOUNTER — Inpatient Hospital Stay (HOSPITAL_COMMUNITY): Payer: Medicare Other

## 2015-04-08 ENCOUNTER — Inpatient Hospital Stay (HOSPITAL_COMMUNITY)
Admission: EM | Admit: 2015-04-08 | Discharge: 2015-04-18 | DRG: 064 | Disposition: E | Payer: Medicare Other | Attending: Neurology | Admitting: Neurology

## 2015-04-08 ENCOUNTER — Emergency Department (HOSPITAL_COMMUNITY): Payer: Medicare Other

## 2015-04-08 ENCOUNTER — Encounter (HOSPITAL_COMMUNITY): Payer: Self-pay | Admitting: *Deleted

## 2015-04-08 DIAGNOSIS — G8194 Hemiplegia, unspecified affecting left nondominant side: Secondary | ICD-10-CM | POA: Diagnosis present

## 2015-04-08 DIAGNOSIS — G919 Hydrocephalus, unspecified: Secondary | ICD-10-CM | POA: Diagnosis present

## 2015-04-08 DIAGNOSIS — R402322 Coma scale, best motor response, extension, at arrival to emergency department: Secondary | ICD-10-CM | POA: Diagnosis present

## 2015-04-08 DIAGNOSIS — J9600 Acute respiratory failure, unspecified whether with hypoxia or hypercapnia: Secondary | ICD-10-CM

## 2015-04-08 DIAGNOSIS — R402112 Coma scale, eyes open, never, at arrival to emergency department: Secondary | ICD-10-CM | POA: Diagnosis present

## 2015-04-08 DIAGNOSIS — Z7901 Long term (current) use of anticoagulants: Secondary | ICD-10-CM | POA: Diagnosis not present

## 2015-04-08 DIAGNOSIS — I615 Nontraumatic intracerebral hemorrhage, intraventricular: Principal | ICD-10-CM | POA: Diagnosis present

## 2015-04-08 DIAGNOSIS — Z79899 Other long term (current) drug therapy: Secondary | ICD-10-CM | POA: Diagnosis not present

## 2015-04-08 DIAGNOSIS — Z952 Presence of prosthetic heart valve: Secondary | ICD-10-CM

## 2015-04-08 DIAGNOSIS — I610 Nontraumatic intracerebral hemorrhage in hemisphere, subcortical: Secondary | ICD-10-CM | POA: Diagnosis not present

## 2015-04-08 DIAGNOSIS — G935 Compression of brain: Secondary | ICD-10-CM

## 2015-04-08 DIAGNOSIS — E785 Hyperlipidemia, unspecified: Secondary | ICD-10-CM | POA: Diagnosis present

## 2015-04-08 DIAGNOSIS — I161 Hypertensive emergency: Secondary | ICD-10-CM | POA: Diagnosis present

## 2015-04-08 DIAGNOSIS — Z515 Encounter for palliative care: Secondary | ICD-10-CM | POA: Diagnosis present

## 2015-04-08 DIAGNOSIS — Z66 Do not resuscitate: Secondary | ICD-10-CM | POA: Diagnosis not present

## 2015-04-08 DIAGNOSIS — D6832 Hemorrhagic disorder due to extrinsic circulating anticoagulants: Secondary | ICD-10-CM | POA: Diagnosis present

## 2015-04-08 DIAGNOSIS — I611 Nontraumatic intracerebral hemorrhage in hemisphere, cortical: Secondary | ICD-10-CM

## 2015-04-08 DIAGNOSIS — R402212 Coma scale, best verbal response, none, at arrival to emergency department: Secondary | ICD-10-CM | POA: Diagnosis present

## 2015-04-08 DIAGNOSIS — G936 Cerebral edema: Secondary | ICD-10-CM | POA: Diagnosis present

## 2015-04-08 DIAGNOSIS — T45515A Adverse effect of anticoagulants, initial encounter: Secondary | ICD-10-CM | POA: Diagnosis present

## 2015-04-08 DIAGNOSIS — Z885 Allergy status to narcotic agent status: Secondary | ICD-10-CM

## 2015-04-08 DIAGNOSIS — R29810 Facial weakness: Secondary | ICD-10-CM | POA: Diagnosis present

## 2015-04-08 DIAGNOSIS — Z7982 Long term (current) use of aspirin: Secondary | ICD-10-CM

## 2015-04-08 DIAGNOSIS — R4020 Unspecified coma: Secondary | ICD-10-CM

## 2015-04-08 DIAGNOSIS — I619 Nontraumatic intracerebral hemorrhage, unspecified: Secondary | ICD-10-CM | POA: Diagnosis present

## 2015-04-08 LAB — CBC
HCT: 40 % (ref 36.0–46.0)
Hemoglobin: 13.3 g/dL (ref 12.0–15.0)
MCH: 31.6 pg (ref 26.0–34.0)
MCHC: 33.3 g/dL (ref 30.0–36.0)
MCV: 95 fL (ref 78.0–100.0)
Platelets: 158 10*3/uL (ref 150–400)
RBC: 4.21 MIL/uL (ref 3.87–5.11)
RDW: 12.9 % (ref 11.5–15.5)
WBC: 8.2 10*3/uL (ref 4.0–10.5)

## 2015-04-08 LAB — URINALYSIS, ROUTINE W REFLEX MICROSCOPIC
BILIRUBIN URINE: NEGATIVE
Glucose, UA: NEGATIVE mg/dL
KETONES UR: NEGATIVE mg/dL
Leukocytes, UA: NEGATIVE
Nitrite: NEGATIVE
PH: 7 (ref 5.0–8.0)
Protein, ur: NEGATIVE mg/dL
SPECIFIC GRAVITY, URINE: 1.013 (ref 1.005–1.030)

## 2015-04-08 LAB — I-STAT CHEM 8, ED
BUN: 27 mg/dL — ABNORMAL HIGH (ref 6–20)
Calcium, Ion: 1.09 mmol/L — ABNORMAL LOW (ref 1.13–1.30)
Chloride: 104 mmol/L (ref 101–111)
Creatinine, Ser: 1.2 mg/dL — ABNORMAL HIGH (ref 0.44–1.00)
Glucose, Bld: 102 mg/dL — ABNORMAL HIGH (ref 65–99)
HEMATOCRIT: 42 % (ref 36.0–46.0)
HEMOGLOBIN: 14.3 g/dL (ref 12.0–15.0)
Potassium: 3.8 mmol/L (ref 3.5–5.1)
Sodium: 143 mmol/L (ref 135–145)
TCO2: 24 mmol/L (ref 0–100)

## 2015-04-08 LAB — RAPID URINE DRUG SCREEN, HOSP PERFORMED
Amphetamines: NOT DETECTED
BENZODIAZEPINES: NOT DETECTED
Barbiturates: NOT DETECTED
Cocaine: NOT DETECTED
OPIATES: NOT DETECTED
TETRAHYDROCANNABINOL: NOT DETECTED

## 2015-04-08 LAB — COMPREHENSIVE METABOLIC PANEL
ALBUMIN: 4 g/dL (ref 3.5–5.0)
ALT: 12 U/L — ABNORMAL LOW (ref 14–54)
ANION GAP: 12 (ref 5–15)
AST: 25 U/L (ref 15–41)
Alkaline Phosphatase: 89 U/L (ref 38–126)
BUN: 24 mg/dL — AB (ref 6–20)
CHLORIDE: 106 mmol/L (ref 101–111)
CO2: 24 mmol/L (ref 22–32)
Calcium: 9.6 mg/dL (ref 8.9–10.3)
Creatinine, Ser: 1.2 mg/dL — ABNORMAL HIGH (ref 0.44–1.00)
GFR calc Af Amer: 49 mL/min — ABNORMAL LOW (ref >60)
GFR, EST NON AFRICAN AMERICAN: 42 mL/min — AB (ref >60)
Glucose, Bld: 105 mg/dL — ABNORMAL HIGH (ref 65–99)
Potassium: 4 mmol/L (ref 3.5–5.1)
Sodium: 142 mmol/L (ref 135–145)
TOTAL PROTEIN: 6.9 g/dL (ref 6.5–8.1)
Total Bilirubin: 0.3 mg/dL (ref 0.3–1.2)

## 2015-04-08 LAB — DIFFERENTIAL
BASOS PCT: 0 %
Basophils Absolute: 0 10*3/uL (ref 0.0–0.1)
EOS PCT: 2 %
Eosinophils Absolute: 0.2 10*3/uL (ref 0.0–0.7)
Lymphocytes Relative: 37 %
Lymphs Abs: 3 10*3/uL (ref 0.7–4.0)
Monocytes Absolute: 0.6 10*3/uL (ref 0.1–1.0)
Monocytes Relative: 8 %
NEUTROS PCT: 53 %
Neutro Abs: 4.4 10*3/uL (ref 1.7–7.7)

## 2015-04-08 LAB — PROTIME-INR
INR: 3.56 — AB (ref 0.00–1.49)
INR: 1.31 (ref 0.00–1.49)
PROTHROMBIN TIME: 16.4 s — AB (ref 11.6–15.2)
Prothrombin Time: 34.8 seconds — ABNORMAL HIGH (ref 11.6–15.2)

## 2015-04-08 LAB — I-STAT TROPONIN, ED: TROPONIN I, POC: 0 ng/mL (ref 0.00–0.08)

## 2015-04-08 LAB — URINE MICROSCOPIC-ADD ON: Bacteria, UA: NONE SEEN

## 2015-04-08 LAB — TRIGLYCERIDES: Triglycerides: 135 mg/dL (ref <150)

## 2015-04-08 LAB — ETHANOL

## 2015-04-08 LAB — APTT: APTT: 42 s — AB (ref 24–37)

## 2015-04-08 MED ORDER — VITAMIN K1 10 MG/ML IJ SOLN
10.0000 mg | INTRAVENOUS | Status: AC
  Administered 2015-04-08: 10 mg via INTRAVENOUS
  Filled 2015-04-08: qty 1

## 2015-04-08 MED ORDER — ACETAMINOPHEN 325 MG PO TABS: 650.0000 mg | ORAL_TABLET | ORAL | Status: DC | PRN

## 2015-04-08 MED ORDER — ACETAMINOPHEN 650 MG RE SUPP: 650.0000 mg | RECTAL | Status: DC | PRN

## 2015-04-08 MED ORDER — SENNOSIDES-DOCUSATE SODIUM 8.6-50 MG PO TABS: 1.0000 | ORAL_TABLET | Freq: Two times a day (BID) | ORAL | Status: DC

## 2015-04-08 MED ORDER — PANTOPRAZOLE SODIUM 40 MG IV SOLR: 40.0000 mg | Freq: Every day | INTRAVENOUS | Status: DC

## 2015-04-08 MED ORDER — ETOMIDATE 2 MG/ML IV SOLN
INTRAVENOUS | Status: DC | PRN
  Administered 2015-04-08: 20 mg via INTRAVENOUS

## 2015-04-08 MED ORDER — SUCCINYLCHOLINE CHLORIDE 20 MG/ML IJ SOLN
INTRAMUSCULAR | Status: DC | PRN
  Administered 2015-04-08: 50 mg via INTRAVENOUS
  Administered 2015-04-08: 100 mg via INTRAVENOUS

## 2015-04-08 MED ORDER — PROPOFOL 1000 MG/100ML IV EMUL
0.0000 ug/kg/min | INTRAVENOUS | Status: DC
  Administered 2015-04-08: 30 ug/kg/min via INTRAVENOUS

## 2015-04-08 MED ORDER — PROPOFOL 1000 MG/100ML IV EMUL: 0.0000 ug/kg/min | INTRAVENOUS | Status: DC

## 2015-04-08 MED ORDER — LABETALOL HCL 5 MG/ML IV SOLN: 10.0000 mg | INTRAVENOUS | Status: DC | PRN

## 2015-04-08 MED ORDER — NICARDIPINE HCL IN NACL 20-0.86 MG/200ML-% IV SOLN
3.0000 mg/h | Freq: Once | INTRAVENOUS | Status: AC
  Administered 2015-04-08: 3 mg/h via INTRAVENOUS
  Filled 2015-04-08: qty 200

## 2015-04-08 MED ORDER — SODIUM CHLORIDE 3 % IV SOLN
INTRAVENOUS | Status: DC
  Administered 2015-04-09: 75 mL/h via INTRAVENOUS
  Administered 2015-04-09: 50 mL/h via INTRAVENOUS
  Filled 2015-04-08 (×3): qty 500

## 2015-04-08 MED ORDER — PROPOFOL 1000 MG/100ML IV EMUL
INTRAVENOUS | Status: AC
  Filled 2015-04-08: qty 100

## 2015-04-08 MED ORDER — STROKE: EARLY STAGES OF RECOVERY BOOK
Freq: Once | Status: AC
  Administered 2015-04-08: 23:00:00
  Filled 2015-04-08: qty 1

## 2015-04-08 MED ORDER — NICARDIPINE HCL IN NACL 40-0.83 MG/200ML-% IV SOLN
0.0000 mg/h | INTRAVENOUS | Status: DC
  Administered 2015-04-09: 1.5 mg/h via INTRAVENOUS
  Filled 2015-04-08: qty 200

## 2015-04-08 MED ORDER — PROTHROMBIN COMPLEX CONC HUMAN 500 UNITS IV KIT
1491.0000 [IU] | PACK | Status: AC
  Administered 2015-04-08: 1491 [IU] via INTRAVENOUS
  Filled 2015-04-08: qty 60

## 2015-04-08 NOTE — H&P (Signed)
Neurology H&P  CC: ICH  History is obtained from: medical record  HPI: Monica Wolf is a 80 y.o. female who was at churchwhen she had sudden onset difficulty moving her left side. EMS was called and brought her in as a code stroke. In the ER, she was agitated, not following commands, purposeful with her right side but not moving her left. She had decreased mental status and after the CT revealed ICH, she was taken to the trauma bay and intubated due to decreased responsiveness.   She is on Coumadin and had a supratherapeutic INR and therefore received K centra As well as vitamin K.    LKW: 6 PM tpa given?: noICH ICH Score: 3   ROS:   Unable to obtain due to altered mental status.   Past Medical History  Diagnosis Date  . Arthritis   . PONV (postoperative nausea and vomiting)     nausea  only  . Hypertension   . Aortic stenosis 2004    valve replacement     No family history on file. Unable to obtain family history due to altered mental status  Social History:  reports that she has never smoked. She has never used smokeless tobacco. She reports that she does not drink alcohol or use illicit drugs.   Exam: Current vital signs: BP 154/99 mmHg  Pulse 66  Temp(Src) 95.4 F (35.2 C)  Resp 25  Wt 55.339 kg (122 lb)  SpO2 100% Vital signs in last 24 hours: Temp:  [94.8 F (34.9 C)-95.4 F (35.2 C)] 95.4 F (35.2 C) (02/19 2140) Pulse Rate:  [51-80] 66 (02/19 2140) Resp:  [15-27] 25 (02/19 2140) BP: (145-230)/(54-116) 154/99 mmHg (02/19 2140) SpO2:  [96 %-100 %] 100 % (02/19 2140) FiO2 (%):  [50 %] 50 % (02/19 1937) Weight:  [55.339 kg (122 lb)] 55.339 kg (122 lb) (02/19 1920)   Physical Exam  Constitutional: Appears well-developed and well-nourished.  Psych: does not speak  Eyes: No scleral injection HENT: ET tube in place head : Normocephalic.  Cardiovascular: Normal rate and regular rhythm.  Resp: Ventilated GI: Soft.  No distension. There is no  tenderness.  Skin: WDI  Neuro: Mental Status: Patient is  obtunded, she opens her eyes to noxious stimulation, does not  follow commands.   Cranial Nerves: II:  does not blink to threat  Pupils are equal, round, and reactive to light.   III,IV, VI:  eyes are conjugate, midline  V,VII: corneals are intact  VIII, X, XI, XII: Unable to assess secondary to patient's altered mental status.  Motor: Initially she was purposeful on the right, flaccid on the left, subsequently she began extensor posturing bilaterally.  Sensory:  extensor posturing in all 4 extremities to noxious stimulus  Cerebellar:  unable to perform   I have reviewed labs in epic and the results pertinent to this consultation are: INR 3.5 -> 1.3 Chem 8 borederline creatinine  I have reviewed the images obtained:CT head- large ICH which expanded over a few hours.   Impression: 80 yo F with large ICH that is expanding. I suspect that this is going to be a fatal event for this unfortunate lady. Neurosurgery has been consulted and discussed craniotomy with the family, which was declined. This is a devastating injury and with her worsening exam, I suspect that this will be a fatal injury. Family wants to continue medical management for now overnight, will repeat head CT in the AM.   Recommendations: 1) Admit to ICU  2) no antiplatelets or anticoagulants 3) blood pressure control with goal systolic < 140 4) Frequent neuro checks 5) appreciate neurosurgical consultation.  6) have consulted CCM, will need central line for 3% saline.      This patient is critically ill and at significant risk of neurological worsening, death and care requires constant monitoring of vital signs, hemodynamics,respiratory and cardiac monitoring, neurological assessment, discussion with family, other specialists and medical decision making of high complexity. I spent 60 minutes of neurocritical care time  in the care of  this patient.  Ritta Slot, MD [facility] [phone]  If 7pm- 7am, please page neurology on call as listed in AMION. 27-Apr-2015  10:16 PM

## 2015-04-08 NOTE — ED Notes (Signed)
Patient vomiting around the NG.  Placement checked and in the proper space.

## 2015-04-08 NOTE — Procedures (Signed)
Central Venous Catheter Insertion Procedure Note Monica Wolf [id number] January 28, 1936  Procedure: Insertion of Central Venous Catheter Indications: Drug and/or fluid administration  Procedure Details Consent: Risks of procedure as well as the alternatives and risks of each were explained to the (patient/caregiver).  Consent for procedure obtained. Time Out: Verified patient identification, verified procedure, site/side was marked, verified correct patient position, special equipment/implants available, medications/allergies/relevent history reviewed, required imaging and test results available.  Performed  Maximum sterile technique was used including antiseptics, cap, gloves, gown, hand hygiene, mask and sheet. Skin prep: Chlorhexidine; local anesthetic administered A antimicrobial bonded/coated triple lumen catheter was placed in the left femoral vein using the Seldinger technique.  Evaluation Blood flow good Complications: No apparent complications Patient did tolerate procedure well. Chest X-ray ordered to verify placement.  CXR: not necessary.  Monica Wolf 2015/04/13, 11:55 PM

## 2015-04-08 NOTE — Progress Notes (Signed)
Patient ID: Monica Wolf, female   DOB: [birth year], 80 y.o.   MRN: [id number] While patient still in ER, noted to have worsening extensor posture and vomiting despite having ng tube. Repeated CT and this shows ICH has at least doubled in size. With marked increase in shift. Explained gravity of situation to the husband and he wants to discuss w family and friends regarding proceeding with surgical intervention.

## 2015-04-08 NOTE — Progress Notes (Signed)
Patient ID: Monica Wolf, female   DOB: 1935/09/29, 80 y.o.   MRN: [id number] After a brief discussion with the family and friends, Mr. Varone has expressed the desire not to pursue surgical intervention.

## 2015-04-08 NOTE — Consult Note (Signed)
Reason for Consult:Intracerebral hemorhhage Referring Physician: LOUIS GAW is an 80 y.o. female.  HPI: Patient is a 80 yo individual on coumadin anticoagulation for prosthetic heart valve. This evening while at church patient suddenly became weak and was brought to ER. She was weakly responsive and hemiplegic. She was intubated. CT shows large right sided intracerebral hemorrhage measuring 6 cm in maximal size. She has received K centra.  Past Medical History  Diagnosis Date  . Arthritis   . PONV (postoperative nausea and vomiting)     nausea  only  . Hypertension   . Aortic stenosis 2004    valve replacement    Past Surgical History  Procedure Laterality Date  . Cardiac valve replacement      2004  . Hand surgery      right  . Colonoscopy  2010?  Marland Kitchen Cardiac catheterization  2004  . Cataract extraction w/phaco  07/16/2011    Procedure: CATARACT EXTRACTION PHACO AND INTRAOCULAR LENS PLACEMENT (IOC);  Surgeon: Adonis Brook, MD;  Location: [facility];  Service: Ophthalmology;  Laterality: Left;    No family history on file.  Social History:  reports that she has never smoked. She does not have any smokeless tobacco history on file. She reports that she does not drink alcohol or use illicit drugs.  Allergies:  Allergies  Allergen Reactions  . Codeine Nausea And Vomiting    Severe nausea    Medications: I have reviewed the patient's current medications.  Results for orders placed or performed during the hospital encounter of 03/25/2015 (from the past 48 hour(s))  Ethanol     Status: None   Collection Time: 04/02/2015  7:00 PM  Result Value Ref Range   Alcohol, Ethyl (B) <5 <5 mg/dL    Comment:        LOWEST DETECTABLE LIMIT FOR SERUM ALCOHOL IS 5 mg/dL FOR MEDICAL PURPOSES ONLY   Protime-INR     Status: Abnormal   Collection Time: 04/11/2015  7:00 PM  Result Value Ref Range   Prothrombin Time 34.8 (H) 11.6 - 15.2 seconds   INR 3.56 (H) 0.00 - 1.49  APTT      Status: Abnormal   Collection Time: 03/31/2015  7:00 PM  Result Value Ref Range   aPTT 42 (H) 24 - 37 seconds    Comment:        IF BASELINE aPTT IS ELEVATED, SUGGEST PATIENT RISK ASSESSMENT BE USED TO DETERMINE APPROPRIATE ANTICOAGULANT THERAPY.   CBC     Status: None   Collection Time: 03/29/2015  7:00 PM  Result Value Ref Range   WBC 8.2 4.0 - 10.5 K/uL   RBC 4.21 3.87 - 5.11 MIL/uL   Hemoglobin 13.3 12.0 - 15.0 g/dL   HCT 40.0 36.0 - 46.0 %   MCV 95.0 78.0 - 100.0 fL   MCH 31.6 26.0 - 34.0 pg   MCHC 33.3 30.0 - 36.0 g/dL   RDW 12.9 11.5 - 15.5 %   Platelets 158 150 - 400 K/uL  Differential     Status: None   Collection Time: 04/16/2015  7:00 PM  Result Value Ref Range   Neutrophils Relative % 53 %   Neutro Abs 4.4 1.7 - 7.7 K/uL   Lymphocytes Relative 37 %   Lymphs Abs 3.0 0.7 - 4.0 K/uL   Monocytes Relative 8 %   Monocytes Absolute 0.6 0.1 - 1.0 K/uL   Eosinophils Relative 2 %   Eosinophils Absolute 0.2 0.0 - 0.7  K/uL   Basophils Relative 0 %   Basophils Absolute 0.0 0.0 - 0.1 K/uL  Comprehensive metabolic panel     Status: Abnormal   Collection Time: 03/29/2015  7:00 PM  Result Value Ref Range   Sodium 142 135 - 145 mmol/L   Potassium 4.0 3.5 - 5.1 mmol/L   Chloride 106 101 - 111 mmol/L   CO2 24 22 - 32 mmol/L   Glucose, Bld 105 (H) 65 - 99 mg/dL   BUN 24 (H) 6 - 20 mg/dL   Creatinine, Ser 1.20 (H) 0.44 - 1.00 mg/dL   Calcium 9.6 8.9 - 10.3 mg/dL   Total Protein 6.9 6.5 - 8.1 g/dL   Albumin 4.0 3.5 - 5.0 g/dL   AST 25 15 - 41 U/L   ALT 12 (L) 14 - 54 U/L   Alkaline Phosphatase 89 38 - 126 U/L   Total Bilirubin 0.3 0.3 - 1.2 mg/dL   GFR calc non Af Amer 42 (L) >60 mL/min   GFR calc Af Amer 49 (L) >60 mL/min    Comment: (NOTE) The eGFR has been calculated using the CKD EPI equation. This calculation has not been validated in all clinical situations. eGFR's persistently <60 mL/min signify possible Chronic Kidney Disease.    Anion gap 12 5 - 15  I-stat troponin,  ED (not at [facility], [facility])     Status: None   Collection Time: 03/27/2015  7:12 PM  Result Value Ref Range   Troponin i, poc 0.00 0.00 - 0.08 ng/mL   Comment 3            Comment: Due to the release kinetics of cTnI, a negative result within the first hours of the onset of symptoms does not rule out myocardial infarction with certainty. If myocardial infarction is still suspected, repeat the test at appropriate intervals.   I-Stat Chem 8, ED  (not at [facility], [facility])     Status: Abnormal   Collection Time: 03/30/2015  7:14 PM  Result Value Ref Range   Sodium 143 135 - 145 mmol/L   Potassium 3.8 3.5 - 5.1 mmol/L   Chloride 104 101 - 111 mmol/L   BUN 27 (H) 6 - 20 mg/dL   Creatinine, Ser 1.20 (H) 0.44 - 1.00 mg/dL   Glucose, Bld 102 (H) 65 - 99 mg/dL   Calcium, Ion 1.09 (L) 1.13 - 1.30 mmol/L   TCO2 24 0 - 100 mmol/L   Hemoglobin 14.3 12.0 - 15.0 g/dL   HCT 42.0 36.0 - 46.0 %  Urine rapid drug screen (hosp performed)not at [facility]     Status: None   Collection Time: 03/22/2015  7:44 PM  Result Value Ref Range   Opiates NONE DETECTED NONE DETECTED   Cocaine NONE DETECTED NONE DETECTED   Benzodiazepines NONE DETECTED NONE DETECTED   Amphetamines NONE DETECTED NONE DETECTED   Tetrahydrocannabinol NONE DETECTED NONE DETECTED   Barbiturates NONE DETECTED NONE DETECTED    Comment:        DRUG SCREEN FOR MEDICAL PURPOSES ONLY.  IF CONFIRMATION IS NEEDED FOR ANY PURPOSE, NOTIFY LAB WITHIN 5 DAYS.        LOWEST DETECTABLE LIMITS FOR URINE DRUG SCREEN Drug Class       Cutoff (ng/mL) Amphetamine      1000 Barbiturate      200 Benzodiazepine   371 Tricyclics       696 Opiates          300 Cocaine  300 THC              50   Urinalysis, Routine w reflex microscopic (not at [facility])     Status: Abnormal   Collection Time: 04/13/2015  7:44 PM  Result Value Ref Range   Color, Urine YELLOW YELLOW   APPearance CLOUDY (A) CLEAR   Specific Gravity, Urine 1.013 1.005 - 1.030   pH 7.0 5.0 - 8.0    Glucose, UA NEGATIVE NEGATIVE mg/dL   Hgb urine dipstick MODERATE (A) NEGATIVE   Bilirubin Urine NEGATIVE NEGATIVE   Ketones, ur NEGATIVE NEGATIVE mg/dL   Protein, ur NEGATIVE NEGATIVE mg/dL   Nitrite NEGATIVE NEGATIVE   Leukocytes, UA NEGATIVE NEGATIVE  Urine microscopic-add on     Status: Abnormal   Collection Time: 04/02/2015  7:44 PM  Result Value Ref Range   Squamous Epithelial / LPF 0-5 (A) NONE SEEN   WBC, UA 0-5 0 - 5 WBC/hpf   RBC / HPF 0-5 0 - 5 RBC/hpf   Bacteria, UA NONE SEEN NONE SEEN    Ct Head Wo Contrast  04/13/2015  CLINICAL DATA:  Left-sided facial droop, slurred speech. Code stroke. EXAM: CT HEAD WITHOUT CONTRAST TECHNIQUE: Contiguous axial images were obtained from the base of the skull through the vertex without intravenous contrast. COMPARISON:  None. FINDINGS: There is a large intraparenchymal hemorrhage measuring 2.9 x 6.0 x 5.0 cm in the region of the right external capsule/ insular ribbon. There is minimal right to left midline shift of 4.0 mm. There is mild central and cortical atrophy. Periventricular white matter changes are consistent with small vessel disease. Bone windows show atherosclerotic calcification of the internal carotid arteries. No acute fracture. IMPRESSION: 1. Intraparenchymal hemorrhage involving the region of the right external capsule and insula. 2. Small right to left midline shift. 3. Atrophy and small vessel disease. 4. Critical Value/emergent results were called by telephone at the time of interpretation on 04/06/2015 at 7:24 pm to Dr. Amada Jupiter who verbally acknowledged these results. Electronically Signed   By: Norva Pavlov M.D.   On: 04/10/2015 19:25   Dg Chest Portable 1 View  04/13/2015  CLINICAL DATA:  Intubation. EXAM: PORTABLE CHEST 1 VIEW COMPARISON:  07/13/2014 FINDINGS: Endotracheal tubes 10 mm from the carina. Tip and side port of the enteric tube below the diaphragm in the stomach. Patient is post median sternotomy with  prosthetic aortic valve. Cardiomegaly is increased. Perihilar interstitial opacities most concerning for pulmonary edema. Question left pleural effusion. No pneumothorax. Bones are under mineralized. IMPRESSION: 1. Low positioning of endotracheal tube 1 cm from the carina, retraction of 1-2 cm would be optimal positioning. Enteric tube in place below the diaphragm. 2. Cardiomegaly and interstitial edema, most consistent with CHF. Question left pleural effusion. Electronically Signed   By: Rubye Oaks M.D.   On: 04/08/2015 20:06    Review of Systems  Unable to perform ROS: acuity of condition   There were no vitals taken for this visit. Physical Exam  Constitutional: She appears well-developed and well-nourished.  HENT:  Head: Normocephalic and atraumatic.  Neurological:  Pupil on right is 4 mm sluggish, left is 54mm reactive. Patient is intubated. Extensor posturing on right left hemiplegia.    Assessment/Plan: Intracerebral hemorrhage in an anticoagulated individual. INR 3.69. Bleed is in the right external capsule with minimal shift. Basilar cisterns are open.  I have advised observation for now with repeat CT in AM. If clott enlarges or further deterioration occurs may consider surgery, or conservative  course.  Jadian Karman J 03/31/2015, 8:35 PM

## 2015-04-08 NOTE — Progress Notes (Signed)
Chaplain responded to request by ED secretary to provide support to patient's husband because patient was being intubated.  Husband Monica Wolf) was accompanied by their pastor (Rev.Kimball, whose wife later arrived and is in waiting for patient's sister - enroute from [place] - in consult room). Husband shook his head and was visibly upset when doctor indicated that patient had been intubated.  When doctor asked what his wishes were for patient if his wife's heart were to stop, husband said that she "did not want to be on life support" and, when asked, said she had a living will.  Chaplain facilitated getting a copy of 2001 living will which was in this chart and gave it to the nurse.  Chaplain provided emotional and spiritual support to the husband, including prayer.  Note: patient and husband have been married for 15 years, are visitors at [employer], and, according to the pastor's wife, struggle financially.   Chaplain appreciated Dr. Sherene Sires bedside manner with family.    As husband's pastor was present, chaplain alerted him and pastor to availability of spiritual care staff as needed and left.  Please call for follow-up if required.  Theodoro Parma [phone]    03/30/2015 2000  Clinical Encounter Type  Visited With Family  Visit Type Initial;Spiritual support;Critical Care  Consult/Referral To Chaplain  Spiritual Encounters  Spiritual Needs Prayer  Advance Directives (For Healthcare)  Does patient have an advance directive? Yes (does not want heroic measures)  Type of Advance Directive Living will  Copy of advanced directive(s) in chart? Yes (provided to nurse; printed from patient chart)

## 2015-04-08 NOTE — ED Provider Notes (Signed)
CSN: [id number]     Arrival date & time 2015/04/15  1903 History   First MD Initiated Contact with Patient 04-15-15 1930     Chief Complaint  Patient presents with  . Code Stroke   Patient is a 80 y.o. female presenting with neurologic complaint. The history is provided by the patient. No language interpreter was used.  Neurologic Problem This is a new problem. The current episode started today. The problem occurs constantly. The problem has been rapidly worsening. Associated symptoms include nausea. Pertinent negatives include no vomiting. Nothing aggravates the symptoms. She has tried nothing for the symptoms. The treatment provided no relief.   Level V exception due to patient's condition of altered, code stroke.  Past Medical History  Diagnosis Date  . Arthritis   . PONV (postoperative nausea and vomiting)     nausea  only  . Hypertension   . Aortic stenosis 2004    valve replacement   Past Surgical History  Procedure Laterality Date  . Cardiac valve replacement      2004  . Hand surgery      right  . Colonoscopy  2010?  Marland Kitchen Cardiac catheterization  2004  . Cataract extraction w/phaco  07/16/2011    Procedure: CATARACT EXTRACTION PHACO AND INTRAOCULAR LENS PLACEMENT (IOC);  Surgeon: Shade Flood, MD;  Location: [facility] OR;  Service: Ophthalmology;  Laterality: Left;   No family history on file. Social History  Substance Use Topics  . Smoking status: Never Smoker   . Smokeless tobacco: Never Used  . Alcohol Use: No   OB History    No data available     Review of Systems  Unable to perform ROS: Patient unresponsive  Gastrointestinal: Positive for nausea. Negative for vomiting.      Allergies  Codeine  Home Medications   Prior to Admission medications   Medication Sig Start Date End Date Taking? Authorizing Provider  amLODipine (NORVASC) 5 MG tablet Take 1 tablet (5 mg total) by mouth daily. 07/16/14   Orpah Cobb, MD  aspirin EC 81 MG tablet Take 81 mg by mouth  daily.    Historical Provider, MD  atorvastatin (LIPITOR) 20 MG tablet Take 1 tablet (20 mg total) by mouth daily at 6 PM. 07/16/14   Orpah Cobb, MD  carvedilol (COREG) 3.125 MG tablet Take 3.125 mg by mouth 2 (two) times daily with a meal.  07/10/14   Historical Provider, MD  Cholecalciferol (VITAMIN D) 2000 UNITS tablet Take 2,000 Units by mouth daily.    Historical Provider, MD  lisinopril (PRINIVIL,ZESTRIL) 20 MG tablet Take 20 mg by mouth 2 (two) times daily.     Historical Provider, MD  nitroGLYCERIN (NITROSTAT) 0.4 MG SL tablet Place 1 tablet (0.4 mg total) under the tongue every 5 (five) minutes x 3 doses as needed for chest pain. 07/16/14   Orpah Cobb, MD  rOPINIRole (REQUIP) 0.5 MG tablet Take 0.5 mg by mouth See admin instructions. Take 1 tablet (0.5 mg) daily at bedtime, may also take an additional tablet during the day as needed for restless leg syndrome    Historical Provider, MD  vitamin B-12 (CYANOCOBALAMIN) 1000 MCG tablet Take 1,000 mcg by mouth 2 (two) times daily.    Historical Provider, MD  warfarin (COUMADIN) 4 MG tablet Take 4 mg by mouth daily with supper.     Historical Provider, MD   BP 159/76 mmHg  Pulse 70  Temp(Src) 95.5 F (35.3 C)  Resp 20  Wt 55.339 kg  SpO2 100% Physical Exam  Constitutional: She appears well-developed and well-nourished. She appears distressed.  HENT:  Head: Normocephalic and atraumatic.  Eyes:  Pupils 2 mm reactive bilaterally.  Neck: No JVD present.  Cardiovascular: Normal rate, regular rhythm, normal heart sounds and intact distal pulses.   Pulmonary/Chest: Effort normal and breath sounds normal. No stridor. No respiratory distress. She has no wheezes.  Abdominal: Soft. She exhibits no distension. There is no tenderness. There is no rebound and no guarding.  Lymphadenopathy:    She has no cervical adenopathy.  Neurological: A cranial nerve deficit is present. She exhibits abnormal muscle tone. GCS eye subscore is 1. GCS verbal  subscore is 1. GCS motor subscore is 2.  Skin: She is not diaphoretic.    ED Course  .Intubation Date/Time: 04/11/2015 11:22 PM Performed by: Dan Humphreys Authorized by: Bary Castilla LYN Consent: The procedure was performed in an emergent situation. Time out: Immediately prior to procedure a "time out" was called to verify the correct patient, procedure, equipment, support staff and site/side marked as required. Indications: airway protection Intubation method: video-assisted Patient status: paralyzed (RSI) Sedatives: etomidate Paralytic: succinylcholine Laryngoscope size: Mac 3 Tube size: 7.5 mm Tube type: cuffed Number of attempts: 1 Cricoid pressure: no Cords visualized: yes Post-procedure assessment: chest rise and ETCO2 monitor Breath sounds: equal and absent over the epigastrium Cuff inflated: yes ETT to lip: 21 cm Tube secured with: ETT holder Chest x-ray interpreted by me, other physician and radiologist. Chest x-ray findings: endotracheal tube in appropriate position Patient tolerance: Patient tolerated the procedure well with no immediate complications   (including critical care time) Labs Review Labs Reviewed  PROTIME-INR - Abnormal; Notable for the following:    Prothrombin Time 34.8 (*)    INR 3.56 (*)    All other components within normal limits  APTT - Abnormal; Notable for the following:    aPTT 42 (*)    All other components within normal limits  COMPREHENSIVE METABOLIC PANEL - Abnormal; Notable for the following:    Glucose, Bld 105 (*)    BUN 24 (*)    Creatinine, Ser 1.20 (*)    ALT 12 (*)    GFR calc non Af Amer 42 (*)    GFR calc Af Amer 49 (*)    All other components within normal limits  URINALYSIS, ROUTINE W REFLEX MICROSCOPIC (NOT AT [facility]) - Abnormal; Notable for the following:    APPearance CLOUDY (*)    Hgb urine dipstick MODERATE (*)    All other components within normal limits  URINE MICROSCOPIC-ADD ON - Abnormal; Notable for  the following:    Squamous Epithelial / LPF 0-5 (*)    All other components within normal limits  PROTIME-INR - Abnormal; Notable for the following:    Prothrombin Time 16.4 (*)    All other components within normal limits  I-STAT CHEM 8, ED - Abnormal; Notable for the following:    BUN 27 (*)    Creatinine, Ser 1.20 (*)    Glucose, Bld 102 (*)    Calcium, Ion 1.09 (*)    All other components within normal limits  MRSA PCR SCREENING  ETHANOL  CBC  DIFFERENTIAL  URINE RAPID DRUG SCREEN, HOSP PERFORMED  TRIGLYCERIDES  PROTIME-INR  PROTIME-INR  BLOOD GAS, ARTERIAL  I-STAT TROPOININ, ED    Imaging Review Ct Head Wo Contrast  04/04/2015  CLINICAL DATA:  Acute onset of posturing. Follow-up large right-sided intraparenchymal hemorrhage. Initial encounter. EXAM: CT HEAD WITHOUT CONTRAST  TECHNIQUE: Contiguous axial images were obtained from the base of the skull through the vertex without intravenous contrast. COMPARISON:  CT of the head performed earlier today at 7:10 p.m. FINDINGS: The patient's large right-sided intraparenchymal hemorrhage has increased further in size, measuring approximately 6.7 x 4.5 cm, with worsening surrounding edema. This results in 1.4 cm of leftward midline shift, increased from 0.4 cm on the prior study. There is associated hydrocephalus involving the left lateral ventricle, with new blood dependently within the occipital horns of the lateral ventricles and within the third ventricle. Subfalcine herniation is noted, and there is suggestion of mild transtentorial herniation, given intraparenchymal hemorrhage extending into the right thalamus and right cerebral peduncle. There is effacement of the interpeduncular cistern. There is likely some degree of mass effect on the pons, and mild partial effacement of the fourth ventricle. There is no evidence of fracture; visualized osseous structures are unremarkable in appearance. The visualized portions of the orbits are within  normal limits. The paranasal sinuses and mastoid air cells are well-aerated. No significant soft tissue abnormalities are seen. IMPRESSION: 1. Large right-sided intraparenchymal hemorrhage has increased further in size, measuring 6.7 x 4.5 cm, with worsening surrounding edema. This results in 1.4 cm of leftward midline shift, increased from 0.4 cm on the prior study. 2. Associated hydrocephalus involving the left lateral ventricle, worsened from the prior study, with new blood dependently within the occipital horns of the lateral ventricles and within the third ventricle. 3. Subfalcine herniation, with suggestion of mild transtentorial herniation, given intraparenchymal hemorrhage extending into the right thalamus and right cerebral peduncle. Effacement of the interpeduncular cistern and some degree of mass effect on the pons. Mild partial effacement of the fourth ventricle. Critical Value/emergent results were called by telephone at the time of interpretation on 04/13/2015 at 9:45 pm to Dr. Bary Castilla, who verbally acknowledged these results. Electronically Signed   By: Roanna Raider M.D.   On: 04/07/2015 21:54   Ct Head Wo Contrast  03/25/2015  CLINICAL DATA:  Left-sided facial droop, slurred speech. Code stroke. EXAM: CT HEAD WITHOUT CONTRAST TECHNIQUE: Contiguous axial images were obtained from the base of the skull through the vertex without intravenous contrast. COMPARISON:  None. FINDINGS: There is a large intraparenchymal hemorrhage measuring 2.9 x 6.0 x 5.0 cm in the region of the right external capsule/ insular ribbon. There is minimal right to left midline shift of 4.0 mm. There is mild central and cortical atrophy. Periventricular white matter changes are consistent with small vessel disease. Bone windows show atherosclerotic calcification of the internal carotid arteries. No acute fracture. IMPRESSION: 1. Intraparenchymal hemorrhage involving the region of the right external capsule and  insula. 2. Small right to left midline shift. 3. Atrophy and small vessel disease. 4. Critical Value/emergent results were called by telephone at the time of interpretation on 04/07/2015 at 7:24 pm to Dr. Amada Jupiter who verbally acknowledged these results. Electronically Signed   By: Norva Pavlov M.D.   On: 04/10/2015 19:25   Dg Chest Portable 1 View  04/08/2015  CLINICAL DATA:  Intubation. EXAM: PORTABLE CHEST 1 VIEW COMPARISON:  07/13/2014 FINDINGS: Endotracheal tubes 10 mm from the carina. Tip and side port of the enteric tube below the diaphragm in the stomach. Patient is post median sternotomy with prosthetic aortic valve. Cardiomegaly is increased. Perihilar interstitial opacities most concerning for pulmonary edema. Question left pleural effusion. No pneumothorax. Bones are under mineralized. IMPRESSION: 1. Low positioning of endotracheal tube 1 cm from the carina, retraction of  1-2 cm would be optimal positioning. Enteric tube in place below the diaphragm. 2. Cardiomegaly and interstitial edema, most consistent with CHF. Question left pleural effusion. Electronically Signed   By: Rubye Oaks M.D.   On: 04/12/2015 20:06   I have personally reviewed and evaluated these images and lab results as part of my medical decision-making.   EKG Interpretation None      MDM   Final diagnoses:  Nontraumatic intraventricular intracerebral hemorrhage, unspecified laterality (HCC)    Patient with history of mechanical heart valve on Coumadin presents for evaluation of left-sided facial droop and left eye deviation. She was taken to CT scanner where she was found a large intraparenchymal hemorrhage. No report of trauma. Patient intubated for airway protection with etomidate and succinylcholine.  Neurology at bedside immediately upon arrival to ED. Patient given KCENTRA, vitamin K to reverse  coumadin.  Nicardipine drip for hypertension.   EKG with sinus rhythm, rate 63 bpm. No ischemic  changes.  Consultation neurosurgery saw evaluated patient at bedside. Patient with extensor posturing in ED. Repeat head CT with subfalcine herniation. Patient taken to ICU for further evaluation by neurosurgery and potential operative intervention.  Updated husband on plan of care at bedside.  Discussed case with Dr. Corlis Leak.      Dan Humphreys, MD 04/11/2015 2328  Courteney Randall An, MD 2015/05/09 1610

## 2015-04-08 NOTE — Consult Note (Addendum)
PULMONARY / CRITICAL CARE MEDICINE   Name: Monica Wolf MRN: [id number] DOB: November 02, 1935    ADMISSION DATE:  04-15-2015 CONSULTATION DATE:  04/15/15  REFERRING MD:  Amada Jupiter  CHIEF COMPLAINT:  ICH  HISTORY OF PRESENT ILLNESS:   79yo caucasian female was at church earlier today when she had difficulty with the left side of her body. She was brought to the ED and was noted to have an IPH. She was assessed by both neurology and neurosurgery. Intubated in the ED for airway protection. Brought up to the neuroICU on a cardene gtt.   PAST MEDICAL HISTORY :  She  has a past medical history of Arthritis; PONV (postoperative nausea and vomiting); Hypertension; and Aortic stenosis (2004).  PAST SURGICAL HISTORY: She  has past surgical history that includes Cardiac valve replacement; Hand surgery; Colonoscopy (2010?); Cardiac catheterization (2004); and Cataract extraction w/PHACO (07/16/2011).  Allergies  Allergen Reactions  . Codeine Nausea And Vomiting    Severe nausea    No current facility-administered medications on file prior to encounter.   Current Outpatient Prescriptions on File Prior to Encounter  Medication Sig  . amLODipine (NORVASC) 5 MG tablet Take 1 tablet (5 mg total) by mouth daily.  Marland Kitchen aspirin EC 81 MG tablet Take 81 mg by mouth daily.  Marland Kitchen atorvastatin (LIPITOR) 20 MG tablet Take 1 tablet (20 mg total) by mouth daily at 6 PM.  . carvedilol (COREG) 3.125 MG tablet Take 3.125 mg by mouth 2 (two) times daily with a meal.   . Cholecalciferol (VITAMIN D) 2000 UNITS tablet Take 2,000 Units by mouth daily.  Marland Kitchen lisinopril (PRINIVIL,ZESTRIL) 20 MG tablet Take 20 mg by mouth 2 (two) times daily.   . nitroGLYCERIN (NITROSTAT) 0.4 MG SL tablet Place 1 tablet (0.4 mg total) under the tongue every 5 (five) minutes x 3 doses as needed for chest pain.  Marland Kitchen rOPINIRole (REQUIP) 0.5 MG tablet Take 0.5 mg by mouth See admin instructions. Take 1 tablet (0.5 mg) daily at bedtime, may also  take an additional tablet during the day as needed for restless leg syndrome  . vitamin B-12 (CYANOCOBALAMIN) 1000 MCG tablet Take 1,000 mcg by mouth 2 (two) times daily.  Marland Kitchen warfarin (COUMADIN) 4 MG tablet Take 4 mg by mouth daily with supper.     FAMILY HISTORY:  Her has no family status information on file.   SOCIAL HISTORY: She  reports that she has never smoked. She has never used smokeless tobacco. She reports that she does not drink alcohol or use illicit drugs.  REVIEW OF SYSTEMS:   Unable to obtain as she is sedated and intubated   SUBJECTIVE:  Unable to communicate with the patient as she is unresponsive and sedated  VITAL SIGNS: BP 159/76 mmHg  Pulse 70  Temp(Src) 95.5 F (35.3 C)  Resp 20  Wt 55.339 kg (122 lb)  SpO2 100%  HEMODYNAMICS:    VENTILATOR SETTINGS: Vent Mode:  [-] PRVC FiO2 (%):  [50 %] 50 % Set Rate:  [14 bmp] 14 bmp Vt Set:  [500 mL] 500 mL PEEP:  [5 cmH20] 5 cmH20 Plateau Pressure:  [18 cmH20-19 cmH20] 18 cmH20  INTAKE / OUTPUT:    PHYSICAL EXAMINATION: General: 80yo caucasian female who appears her stated age in no acute distress. Sedated and intubated. Does not open eyes to commands. Well developed and well nourished,  Neuro:  Right pupil fixed and dilated. Left pupil unreactive but smaller in diameter. No corneal reflexes noted. No cough, no  gag. She is breathing over the vent. Extensor posturing on all 4 extremities. Exam performed on propofol HEENT: Head, normocephalic, atraumatic. Ears symmetric, permeable. Eyes: no conjunctival icterus, no erythema. Nose: permeable, midline septum, ETT present Cardiovascular: S1S2 RRR, some ectopy, systolic murmurNo thrills palpated.  Lungs:  Chest symmetrical with respirations, clear to auscultation bilaterally with no wheezing, crackles, equal expansion.  Abdomen:  Soft, nontender, no guarding, nondistended. Present bowel sounds. Musculoskeletal:  No muscle atrophy noted No swelling nor tenderness of  the joints.  Skin:  No notable scars, rashes, cruises. No bed sores noted.  Lymphatics: no palpable lymphadenopathy of cervical, supraclvicular nor inguinal areas.     LABS:  BMET  Recent Labs Lab 04/01/2015 1900 04/02/2015 1914  NA 142 143  K 4.0 3.8  CL 106 104  CO2 24  --   BUN 24* 27*  CREATININE 1.20* 1.20*  GLUCOSE 105* 102*    Electrolytes  Recent Labs Lab 04/05/2015 1900  CALCIUM 9.6    CBC  Recent Labs Lab 03/22/2015 1900 04/16/2015 1914  WBC 8.2  --   HGB 13.3 14.3  HCT 40.0 42.0  PLT 158  --     Coag's  Recent Labs Lab 04/06/2015 1900 04/06/2015 2107  APTT 42*  --   INR 3.56* 1.31    Sepsis Markers No results for input(s): LATICACIDVEN, PROCALCITON, O2SATVEN in the last 168 hours.  ABG No results for input(s): PHART, PCO2ART, PO2ART in the last 168 hours.  Liver Enzymes  Recent Labs Lab 04/16/2015 1900  AST 25  ALT 12*  ALKPHOS 89  BILITOT 0.3  ALBUMIN 4.0    Cardiac Enzymes No results for input(s): TROPONINI, PROBNP in the last 168 hours.  Glucose No results for input(s): GLUCAP in the last 168 hours.  Imaging Ct Head Wo Contrast  04/07/2015  CLINICAL DATA:  Acute onset of posturing. Follow-up large right-sided intraparenchymal hemorrhage. Initial encounter. EXAM: CT HEAD WITHOUT CONTRAST TECHNIQUE: Contiguous axial images were obtained from the base of the skull through the vertex without intravenous contrast. COMPARISON:  CT of the head performed earlier today at 7:10 p.m. FINDINGS: The patient's large right-sided intraparenchymal hemorrhage has increased further in size, measuring approximately 6.7 x 4.5 cm, with worsening surrounding edema. This results in 1.4 cm of leftward midline shift, increased from 0.4 cm on the prior study. There is associated hydrocephalus involving the left lateral ventricle, with new blood dependently within the occipital horns of the lateral ventricles and within the third ventricle. Subfalcine herniation is  noted, and there is suggestion of mild transtentorial herniation, given intraparenchymal hemorrhage extending into the right thalamus and right cerebral peduncle. There is effacement of the interpeduncular cistern. There is likely some degree of mass effect on the pons, and mild partial effacement of the fourth ventricle. There is no evidence of fracture; visualized osseous structures are unremarkable in appearance. The visualized portions of the orbits are within normal limits. The paranasal sinuses and mastoid air cells are well-aerated. No significant soft tissue abnormalities are seen. IMPRESSION: 1. Large right-sided intraparenchymal hemorrhage has increased further in size, measuring 6.7 x 4.5 cm, with worsening surrounding edema. This results in 1.4 cm of leftward midline shift, increased from 0.4 cm on the prior study. 2. Associated hydrocephalus involving the left lateral ventricle, worsened from the prior study, with new blood dependently within the occipital horns of the lateral ventricles and within the third ventricle. 3. Subfalcine herniation, with suggestion of mild transtentorial herniation, given intraparenchymal hemorrhage extending into the  right thalamus and right cerebral peduncle. Effacement of the interpeduncular cistern and some degree of mass effect on the pons. Mild partial effacement of the fourth ventricle. Critical Value/emergent results were called by telephone at the time of interpretation on 04/11/2015 at 9:45 pm to Dr. Bary Castilla, who verbally acknowledged these results. Electronically Signed   By: Roanna Raider M.D.   On: 03/24/2015 21:54   Ct Head Wo Contrast  04/03/2015  CLINICAL DATA:  Left-sided facial droop, slurred speech. Code stroke. EXAM: CT HEAD WITHOUT CONTRAST TECHNIQUE: Contiguous axial images were obtained from the base of the skull through the vertex without intravenous contrast. COMPARISON:  None. FINDINGS: There is a large intraparenchymal hemorrhage  measuring 2.9 x 6.0 x 5.0 cm in the region of the right external capsule/ insular ribbon. There is minimal right to left midline shift of 4.0 mm. There is mild central and cortical atrophy. Periventricular white matter changes are consistent with small vessel disease. Bone windows show atherosclerotic calcification of the internal carotid arteries. No acute fracture. IMPRESSION: 1. Intraparenchymal hemorrhage involving the region of the right external capsule and insula. 2. Small right to left midline shift. 3. Atrophy and small vessel disease. 4. Critical Value/emergent results were called by telephone at the time of interpretation on 03/25/2015 at 7:24 pm to Dr. Amada Jupiter who verbally acknowledged these results. Electronically Signed   By: Norva Pavlov M.D.   On: 04/13/2015 19:25   Dg Chest Portable 1 View  04/12/2015  CLINICAL DATA:  Intubation. EXAM: PORTABLE CHEST 1 VIEW COMPARISON:  07/13/2014 FINDINGS: Endotracheal tubes 10 mm from the carina. Tip and side port of the enteric tube below the diaphragm in the stomach. Patient is post median sternotomy with prosthetic aortic valve. Cardiomegaly is increased. Perihilar interstitial opacities most concerning for pulmonary edema. Question left pleural effusion. No pneumothorax. Bones are under mineralized. IMPRESSION: 1. Low positioning of endotracheal tube 1 cm from the carina, retraction of 1-2 cm would be optimal positioning. Enteric tube in place below the diaphragm. 2. Cardiomegaly and interstitial edema, most consistent with CHF. Question left pleural effusion. Electronically Signed   By: Rubye Oaks M.D.   On: 03/31/2015 20:06     STUDIES:  n/a  CULTURES: n/a  ANTIBIOTICS: n/a  SIGNIFICANT EVENTS: Patient will not be going to surgery after discussions with family  LINES/TUBES: Femoral CVC placed  DISCUSSION: Pt is critically ill with a very poor prognosis  ASSESSMENT / PLAN:  PULMONARY A: Acute respiratory failure 2/2  airway protection P:  Vent management, f/u ABG, titrate RR/TV as needed. No oxygenation issues at this time   CARDIOVASCULAR A:  Hypertensive emergency P: goal SBP <140, titrate cardene gtt and labetalol as needed. Trend INR,    RENAL A:  No active issues  P:     GASTROINTESTINAL A:   P:  GI prophylaxis w/protonix  HEMATOLOGIC A:  Supratherapeutic INR s/p KCentra P:  Trend INR, no DVT prophylaxis, pharmacologic, due to head bleed  INFECTIOUS A:  No active issues P:     ENDOCRINE A:  No active issues P:     NEUROLOGIC A:  Intracranial hemorrhage with cerebral edema and midline shift  P:  q1h neurochecks.  RASS goal: 0 Started on 3% gtt, goal Na 150-155, q6h Na.  Very poor prognosis.    FAMILY  - Updates: care discussed with spouse and other relatives at bedside. They understand the severity of her illness and I have reviewed the scans with them. Her husband states  that he would not wish to pursue aggressive management including CPR, electrical shocks, or medications should her condition deteriorate. She has been made into a DNR.   - Inter-disciplinary family meet or Palliative Care meeting due tomorrow  Critical care time spent managing ventilator, titrating antihypertensives, initiating 3%, discussing care with family and staff, excluding billable procedures 55 minutes.   Deetta Perla, MD Critical Care Medicine [facility] Pager: [phone]  May 06, 2015, 11:02 PM

## 2015-04-08 NOTE — ED Notes (Signed)
Patient arrived via EMS.  Patient was at church and a family member looked over and noticed a left facial droop and slurred speech.  This was at 1800.  EMS reported upon their arrival she had left facial droop, left arm and leg were flacid.  Upon arrival to the ED they noticed the left sided gaze.  Went immediately to CT.  EMS BP 160/102, HR 68 A fib, 95% RA,  CBG 131.  Patient started vomiting while in CT.

## 2015-04-09 DIAGNOSIS — R4020 Unspecified coma: Secondary | ICD-10-CM

## 2015-04-09 DIAGNOSIS — J9600 Acute respiratory failure, unspecified whether with hypoxia or hypercapnia: Secondary | ICD-10-CM

## 2015-04-09 DIAGNOSIS — I610 Nontraumatic intracerebral hemorrhage in hemisphere, subcortical: Secondary | ICD-10-CM

## 2015-04-09 DIAGNOSIS — I161 Hypertensive emergency: Secondary | ICD-10-CM

## 2015-04-09 DIAGNOSIS — G935 Compression of brain: Secondary | ICD-10-CM

## 2015-04-09 DIAGNOSIS — G936 Cerebral edema: Secondary | ICD-10-CM

## 2015-04-09 DIAGNOSIS — I615 Nontraumatic intracerebral hemorrhage, intraventricular: Principal | ICD-10-CM

## 2015-04-09 LAB — PROTIME-INR
INR: 1.22 (ref 0.00–1.49)
INR: 1.26 (ref 0.00–1.49)
PROTHROMBIN TIME: 15.6 s — AB (ref 11.6–15.2)
PROTHROMBIN TIME: 16 s — AB (ref 11.6–15.2)

## 2015-04-09 LAB — BLOOD GAS, ARTERIAL
Acid-Base Excess: 0.4 mmol/L (ref 0.0–2.0)
BICARBONATE: 24.6 meq/L — AB (ref 20.0–24.0)
DRAWN BY: 41977
FIO2: 0.5
O2 SAT: 99.4 %
PEEP: 5 cmH2O
PH ART: 7.402 (ref 7.350–7.450)
Patient temperature: 98.6
RATE: 14 resp/min
TCO2: 25.8 mmol/L (ref 0–100)
VT: 500 mL
pCO2 arterial: 40.4 mmHg (ref 35.0–45.0)
pO2, Arterial: 217 mmHg — ABNORMAL HIGH (ref 80.0–100.0)

## 2015-04-09 LAB — SODIUM
Sodium: 141 mmol/L (ref 135–145)
Sodium: 144 mmol/L (ref 135–145)

## 2015-04-09 LAB — MRSA PCR SCREENING: MRSA BY PCR: NEGATIVE

## 2015-04-09 MED ORDER — ANTISEPTIC ORAL RINSE SOLUTION (CORINZ)
7.0000 mL | OROMUCOSAL | Status: DC
  Administered 2015-04-09: 7 mL via OROMUCOSAL

## 2015-04-09 MED ORDER — CHLORHEXIDINE GLUCONATE 0.12% ORAL RINSE (MEDLINE KIT)
15.0000 mL | Freq: Two times a day (BID) | OROMUCOSAL | Status: DC
  Administered 2015-04-09: 15 mL via OROMUCOSAL

## 2015-04-09 MED ORDER — MORPHINE BOLUS VIA INFUSION
5.0000 mg | INTRAVENOUS | Status: DC | PRN
  Filled 2015-04-09: qty 20

## 2015-04-09 MED ORDER — MORPHINE SULFATE 25 MG/ML IV SOLN
10.0000 mg/h | INTRAVENOUS | Status: DC
  Administered 2015-04-09: 10 mg/h via INTRAVENOUS
  Filled 2015-04-09: qty 10

## 2015-04-16 ENCOUNTER — Telehealth: Payer: Self-pay

## 2015-04-16 NOTE — Telephone Encounter (Signed)
On 04/12/2015 I received a death certificate from [employer] (original). The death certificate is for burial. The patient is a patient of Doctor Byrum. The death certificate will be taken to Pulmonary Unit this am for signature. On 12-May-2015 I received the death certificate back from Doctor Craige Cotta who signed the death certificate for Byrum since Doctor Delton Coombes was sick. I got the death certificate ready and called the funeral home to let them know the death certificate is ready for pickup. I also faxed them a copy per their request.

## 2015-04-18 NOTE — Progress Notes (Addendum)
STROKE TEAM PROGRESS NOTE   HISTORY OF PRESENT ILLNESS Monica Wolf is a 80 y.o. female who was at church when she had sudden onset difficulty moving her left side. EMS was called and brought her in as a code stroke. In the ER, she was agitated, not following commands, purposeful with her right side but not moving her left. She had decreased mental status and after the CT revealed ICH, she was taken to the trauma bay and intubated due to decreased responsiveness. She is on Coumadin and had a supratherapeutic INR and therefore received K centra As well as vitamin K. She was LKW 03/30/2015 at 6 PM. ICH Score: 3.  Patient was not administered TPA secondary to [place]. She was admitted to the neuro ICU for further evaluation and treatment.   SUBJECTIVE (INTERVAL HISTORY) Her husband is at the bedside. He understands the gravity of his wife's illness. Neurological he remains comatose unresponsive.   OBJECTIVE Temp:  [94.8 F (34.9 C)-99 F (37.2 C)] 99 F (37.2 C) (02/20 0715) Pulse Rate:  [51-80] 68 (02/20 0715) Cardiac Rhythm:  [-]  Resp:  [12-28] 15 (02/20 0715) BP: (117-230)/(53-116) 129/53 mmHg (02/20 0715) SpO2:  [96 %-100 %] 100 % (02/20 0715) FiO2 (%):  [30 %-50 %] 30 % (02/20 0259) Weight:  [55.339 kg (122 lb)] 55.339 kg (122 lb) (02/19 1920)  CBC:  Recent Labs Lab 04/02/2015 1900 04/13/2015 1914  WBC 8.2  --   NEUTROABS 4.4  --   HGB 13.3 14.3  HCT 40.0 42.0  MCV 95.0  --   PLT 158  --     Basic Metabolic Panel:  Recent Labs Lab 04/17/2015 1900 04/06/2015 1914 Apr 21, 2015 0030 April 21, 2015 0430  NA 142 143 141 144  K 4.0 3.8  --   --   CL 106 104  --   --   CO2 24  --   --   --   GLUCOSE 105* 102*  --   --   BUN 24* 27*  --   --   CREATININE 1.20* 1.20*  --   --   CALCIUM 9.6  --   --   --     Lipid Panel:    Component Value Date/Time   CHOL 142 07/16/2014 0425   TRIG 135 03/21/2015 2013   HDL 46 07/16/2014 0425   CHOLHDL 3.1 07/16/2014 0425   VLDL 22 07/16/2014  0425   LDLCALC 74 07/16/2014 0425   HgbA1c: No results found for: HGBA1C Urine Drug Screen:    Component Value Date/Time   LABOPIA NONE DETECTED 03/21/2015 1944   COCAINSCRNUR NONE DETECTED 04/07/2015 1944   LABBENZ NONE DETECTED 04/07/2015 1944   AMPHETMU NONE DETECTED 04/10/2015 1944   THCU NONE DETECTED 03/30/2015 1944   LABBARB NONE DETECTED 04/14/2015 1944      IMAGING  Ct Head Wo Contrast 03/24/2015  1. Large right-sided intraparenchymal hemorrhage has increased further in size, measuring 6.7 x 4.5 cm, with worsening surrounding edema. This results in 1.4 cm of leftward midline shift, increased from 0.4 cm on the prior study. 2. Associated hydrocephalus involving the left lateral ventricle, worsened from the prior study, with new blood dependently within the occipital horns of the lateral ventricles and within the third ventricle. 3. Subfalcine herniation, with suggestion of mild transtentorial herniation, given intraparenchymal hemorrhage extending into the right thalamus and right cerebral peduncle. Effacement of the interpeduncular cistern and some degree of mass effect on the pons. Mild partial effacement of the fourth  ventricle.  03/28/2015   1. Intraparenchymal hemorrhage involving the region of the right external capsule and insula. 2. Small right to left midline shift. 3. Atrophy and small vessel disease. 4.   Dg Chest Portable 1 View 04/15/2015   1. Low positioning of endotracheal tube 1 cm from the carina, retraction of 1-2 cm would be optimal positioning. Enteric tube in place below the diaphragm. 2. Cardiomegaly and interstitial edema, most consistent with CHF. Question left pleural effusion.    PHYSICAL EXAM Frail elderly Caucasian lady whose intubated. . Afebrile. Head is nontraumatic. Neck is supple without bruit.    Cardiac exam no murmur or gallop. Lungs are clear to auscultation. Distal pulses are well felt. Neurological Exam ;  Comatose and unresponsive. Right gaze  preference eyes are closed. Pupils slightly unequal right 3 mm leftward millimeters sluggishly reactive. Corneal reflexes are diminished bilaterally. Doll's eye movements are sluggish. Fundi could not be visualized. Left lower facial weakness to painful stimuli. Patient postures bilateral upper extremities to sternal rub. She has partial withdrawal in lower extremities to painful stimuli. Tone is increased bilaterally. Plantars are upgoing. ASSESSMENT/PLAN Monica Wolf is a 80 y.o. female with history of hypertension and aortic stenosis w/ replacement presenting with decreased mental status, not following commands, left hemiparesis. CT showed ICH.  Stroke:  Non-dominant right thalamic and cerebral penuncle hemorrhage, enlarged since with admission with cytotoxic cerebral edema, L midline shift, hydrocephalus, secondary to hypertensive emergency  NS consult ([place]) no surgery  Resultant  VDRF, vomiting, comatose state, L hemiparesis  CT head R external capsule and insular ICH with small R to L shift  Repeat CT head  increased Large R thalamus and cerebral peduncle hemorrhage with worsening surrounding edema, 1.4 cm  L shift, hydrocephalus left lateral ventricle, with IVH, subfalcine herniation, mild transtentorial herniation, thalamus and right cerebral peduncle. Effacement of the interpeduncular cistern and some degree of mass effect on the pons.   SCDs for VTE prophylaxis  Diet NPO time specified  aspirin 325 mg daily and warfarin daily prior to admission  This patient is critically ill and at significant risk of neurological worsening and death. Patient care requires constant monitoring of vital signs, hemodynamics, respiratory and cardiac monitoring, and neurological assessment.  Dr. Leonie Man discussed diagnosis, prognosis,  treatment options and plan of care with husband. Due to long-term neuro disability and unlikely recovery, he would like to pursue withdrawal of care.  He is  waiting for another family member to be notified first. She is a DNR, do not escalate care.  Coumadin Coagulopathy  Due to [employer] Valve placed 06/2010  INR supratherapeutic at 3.56  KCentra administered  INR now 1.22  Respiratory Failure  Secondary to hemorrhage  Intubated in ED  CCM following, agrees with withdrawal of care  Hypertensive Emergency  BP 230/79 on arrival in setting of neurologic symptoms  started on Cardene  Spikes at 2100 and 0300 to the 170s  Stable this am at 131/56  Hyperlipidemia  Home meds:  lipitor 20  Continue statin at discharge  Other Stroke Risk Factors  Advanced age  Hospital day # 1 I have personally examined this patient, reviewed notes, independently viewed imaging studies, participated in medical decision making and plan of care. I have made any additions or clarifications directly to the above note. Agree with note above. The patient presented with right thalamic and intraventricular hemorrhage which revealed lead and expanded significantly causing brain herniation and significant cytotoxic edema. Her neurological exam is  quite poor and she is unlikely to survive with a meaningful quality of life. I met with the patient's husband and daughter and other family members and had a long discussion about her poor prognosis and answered questions. Family understands and the patient would not have wanted prolonged life support, tracheostomy, PEG tube and nursing home stay which is unavoidable. Family is agreeable to withdrawal of care but await arrival of other family members. This patient is critically ill and at significant risk of neurological worsening, death and care requires constant monitoring of vital signs, hemodynamics,respiratory and cardiac monitoring, extensive review of multiple databases, frequent neurological assessment, discussion with family, other specialists and medical decision making of high complexity.I have made any  additions or clarifications directly to the above note.This critical care time does not reflect procedure time, or teaching time or supervisory time of PA/NP/Med Resident etc but could involve care discussion time.  I spent 50 minutes of neurocritical care time  in the care of  this patient.      Antony Contras, MD Medical Director [facility] Stroke Center Pager: [phone] May 08, 2015 6:01 PM   To contact Stroke Continuity provider, please refer to [email or web address] After hours, contact General Neurology

## 2015-04-18 NOTE — Progress Notes (Signed)
eLink Physician-Brief Progress Note Patient Name: VELVIE THOMASTON DOB: 1935-03-14 MRN: [id number]   Date of Service  25-Apr-2015  HPI/Events of Note  Patient is comfort measures. Charge nurse requests transfer orders to medical floor to create an ICU bed.   eICU Interventions  Will transfer to a palliative care bed.      Intervention Category Minor Interventions: Routine modifications to care plan (e.g. PRN medications for pain, fever)  Sommer,Steven Eugene 04/25/2015, 5:23 PM

## 2015-04-18 NOTE — Progress Notes (Signed)
Chaplain is aware of end of life orders. Chaplain met with family earlier in the day, and the family indicated that they were well supported. Chaplain services available as needed.   Jeri Lager, Chaplain 04-13-2015 3:34 PM

## 2015-04-18 NOTE — Progress Notes (Signed)
   April 27, 2015 1115  Clinical Encounter Type  Visited With Patient and family together;Health care provider  Visit Type Initial;Critical Care;Patient actively dying  Referral From Nurse  Spiritual Encounters  Spiritual Needs Emotional  Stress Factors  Family Stress Factors Loss   Chaplain responded to support the family as there is an upcoming withdrawal of care. Chaplain met with patient's niece and sister. Chaplain facilitated a life review and offered support. Patient seems well supported at this time, and our services are available as needed.   Jeri Lager, Chaplain 2015-04-27 11:16 AM

## 2015-04-18 NOTE — Progress Notes (Signed)
D; family left, no funeral home information, they will call back       dc'd PIV, central ine, await morgue care

## 2015-04-18 NOTE — Progress Notes (Signed)
SLP Cancellation Note  Patient Details Name: Monica Wolf MRN: [id number] DOB: 1936/02/03   Cancelled treatment:       Reason Eval/Treat Not Completed: Patient not medically ready   Joleah Kosak, Riley Nearing 04/12/2015, 8:00 AM

## 2015-04-18 NOTE — Progress Notes (Signed)
PULMONARY / CRITICAL CARE MEDICINE   Name: Monica Wolf MRN: [id number] DOB: [birth year]    ADMISSION DATE:  04/02/2015 CONSULTATION DATE:  04/07/2015  REFERRING MD:  Amada Jupiter  CHIEF COMPLAINT:  ICH  HISTORY OF PRESENT ILLNESS:   79yo caucasian female was at church earlier today when she had difficulty with the left side of her body. She was brought to the ED and was noted to have an IPH. She was assessed by both neurology and neurosurgery. Intubated in the ED for airway protection. Brought up to the neuroICU on a cardene gtt.   SUBJECTIVE:  Unresponsive   VITAL SIGNS: BP 129/53 mmHg  Pulse 68  Temp(Src) 99 F (37.2 C)  Resp 15  Wt 55.339 kg (122 lb)  SpO2 100%  HEMODYNAMICS:    VENTILATOR SETTINGS: Vent Mode:  [-] PRVC FiO2 (%):  [30 %-50 %] 30 % Set Rate:  [14 bmp] 14 bmp Vt Set:  [500 mL] 500 mL PEEP:  [5 cmH20] 5 cmH20 Plateau Pressure:  [18 cmH20-20 cmH20] 20 cmH20  INTAKE / OUTPUT: I/O last 3 completed shifts: In: 2259.2 [I.V.:2259.2] Out: 1850 [Urine:1750; Other:100]  PHYSICAL EXAMINATION: General: 80yo caucasian female who appears her stated age in no acute distress. Sedated and intubated. Does not open eyes to commands. Well developed and well nourished,  Neuro:  Right pupil fixed and dilated. Left pupil unreactive but smaller in diameter. No corneal reflexes noted. No cough, no gag. She is breathing over the vent. Extensor posturing on all 4 extremities. Exam performed on propofol HEENT: Head, normocephalic, atraumatic. Ears symmetric, permeable. Eyes: no conjunctival icterus, no erythema. Nose: permeable, midline septum, ETT present Cardiovascular: S1S2 RRR, some ectopy, systolic murmurNo thrills palpated.  Lungs:  Chest symmetrical with respirations, clear to auscultation bilaterally with no wheezing, crackles, equal expansion.  Abdomen:  Soft, nontender, no guarding, nondistended. Present bowel sounds. Musculoskeletal:  No muscle atrophy noted No  swelling nor tenderness of the joints.  Skin:  No notable scars, rashes, cruises. No bed sores noted.  Lymphatics: no palpable lymphadenopathy of cervical, supraclvicular nor inguinal areas.     LABS:  BMET  Recent Labs Lab 03/21/2015 1900 03/25/2015 1914 04-24-2015 0030 April 24, 2015 0430  NA 142 143 141 144  K 4.0 3.8  --   --   CL 106 104  --   --   CO2 24  --   --   --   BUN 24* 27*  --   --   CREATININE 1.20* 1.20*  --   --   GLUCOSE 105* 102*  --   --     Electrolytes  Recent Labs Lab 04/11/2015 1900  CALCIUM 9.6    CBC  Recent Labs Lab 04/13/2015 1900 03/28/2015 1914  WBC 8.2  --   HGB 13.3 14.3  HCT 40.0 42.0  PLT 158  --     Coag's  Recent Labs Lab 03/23/2015 1900 03/29/2015 2107 04/07/2015 2349 04/24/15 0430  APTT 42*  --   --   --   INR 3.56* 1.31 1.26 1.22    Sepsis Markers No results for input(s): LATICACIDVEN, PROCALCITON, O2SATVEN in the last 168 hours.  ABG  Recent Labs Lab Apr 24, 2015 0005  PHART 7.402  PCO2ART 40.4  PO2ART 217*    Liver Enzymes  Recent Labs Lab 04/15/2015 1900  AST 25  ALT 12*  ALKPHOS 89  BILITOT 0.3  ALBUMIN 4.0    Cardiac Enzymes No results for input(s): TROPONINI, PROBNP in the last 168 hours.  Glucose No results for input(s): GLUCAP in the last 168 hours.  Imaging Ct Head Wo Contrast  04/11/2015  CLINICAL DATA:  Acute onset of posturing. Follow-up large right-sided intraparenchymal hemorrhage. Initial encounter. EXAM: CT HEAD WITHOUT CONTRAST TECHNIQUE: Contiguous axial images were obtained from the base of the skull through the vertex without intravenous contrast. COMPARISON:  CT of the head performed earlier today at 7:10 p.m. FINDINGS: The patient's large right-sided intraparenchymal hemorrhage has increased further in size, measuring approximately 6.7 x 4.5 cm, with worsening surrounding edema. This results in 1.4 cm of leftward midline shift, increased from 0.4 cm on the prior study. There is associated  hydrocephalus involving the left lateral ventricle, with new blood dependently within the occipital horns of the lateral ventricles and within the third ventricle. Subfalcine herniation is noted, and there is suggestion of mild transtentorial herniation, given intraparenchymal hemorrhage extending into the right thalamus and right cerebral peduncle. There is effacement of the interpeduncular cistern. There is likely some degree of mass effect on the pons, and mild partial effacement of the fourth ventricle. There is no evidence of fracture; visualized osseous structures are unremarkable in appearance. The visualized portions of the orbits are within normal limits. The paranasal sinuses and mastoid air cells are well-aerated. No significant soft tissue abnormalities are seen. IMPRESSION: 1. Large right-sided intraparenchymal hemorrhage has increased further in size, measuring 6.7 x 4.5 cm, with worsening surrounding edema. This results in 1.4 cm of leftward midline shift, increased from 0.4 cm on the prior study. 2. Associated hydrocephalus involving the left lateral ventricle, worsened from the prior study, with new blood dependently within the occipital horns of the lateral ventricles and within the third ventricle. 3. Subfalcine herniation, with suggestion of mild transtentorial herniation, given intraparenchymal hemorrhage extending into the right thalamus and right cerebral peduncle. Effacement of the interpeduncular cistern and some degree of mass effect on the pons. Mild partial effacement of the fourth ventricle. Critical Value/emergent results were called by telephone at the time of interpretation on 04/15/2015 at 9:45 pm to Dr. Bary Castilla, who verbally acknowledged these results. Electronically Signed   By: Roanna Raider M.D.   On: 04/03/2015 21:54   Ct Head Wo Contrast  03/31/2015  CLINICAL DATA:  Left-sided facial droop, slurred speech. Code stroke. EXAM: CT HEAD WITHOUT CONTRAST TECHNIQUE:  Contiguous axial images were obtained from the base of the skull through the vertex without intravenous contrast. COMPARISON:  None. FINDINGS: There is a large intraparenchymal hemorrhage measuring 2.9 x 6.0 x 5.0 cm in the region of the right external capsule/ insular ribbon. There is minimal right to left midline shift of 4.0 mm. There is mild central and cortical atrophy. Periventricular white matter changes are consistent with small vessel disease. Bone windows show atherosclerotic calcification of the internal carotid arteries. No acute fracture. IMPRESSION: 1. Intraparenchymal hemorrhage involving the region of the right external capsule and insula. 2. Small right to left midline shift. 3. Atrophy and small vessel disease. 4. Critical Value/emergent results were called by telephone at the time of interpretation on 04/04/2015 at 7:24 pm to Dr. Amada Jupiter who verbally acknowledged these results. Electronically Signed   By: Norva Pavlov M.D.   On: 03/28/2015 19:25   Dg Chest Portable 1 View  04/11/2015  CLINICAL DATA:  Intubation. EXAM: PORTABLE CHEST 1 VIEW COMPARISON:  07/13/2014 FINDINGS: Endotracheal tubes 10 mm from the carina. Tip and side port of the enteric tube below the diaphragm in the stomach. Patient is post median sternotomy  with prosthetic aortic valve. Cardiomegaly is increased. Perihilar interstitial opacities most concerning for pulmonary edema. Question left pleural effusion. No pneumothorax. Bones are under mineralized. IMPRESSION: 1. Low positioning of endotracheal tube 1 cm from the carina, retraction of 1-2 cm would be optimal positioning. Enteric tube in place below the diaphragm. 2. Cardiomegaly and interstitial edema, most consistent with CHF. Question left pleural effusion. Electronically Signed   By: Rubye Oaks M.D.   On: 04/13/2015 20:06     STUDIES:  n/a  CULTURES: n/a  ANTIBIOTICS: n/a  SIGNIFICANT EVENTS: Patient will not be going to surgery after  discussions with family  LINES/TUBES: Femoral CVC 2/19 >>   DISCUSSION: 80 yo with HTN, admitted with large R IPH. Unfortunately likely a terminal event. Intubated and sedated in ICU   ASSESSMENT / PLAN:  PULMONARY A: Acute respiratory failure 2/2 airway protection P:   Vent management, continue current PRVC Goal some degree hyperventilation given cerebral edema Doubt she will be an extubation candidate due to neuro injury   CARDIOVASCULAR A:  Hypertensive emergency P: goal SBP <140, titrate cardene gtt and labetalol as needed.   RENAL A:  No active issues P:   Follow BMP  GASTROINTESTINAL A:   P:  GI prophylaxis w/protonix  HEMATOLOGIC A:  Supratherapeutic INR s/p KCentra P:   Trend INR,  no chemical DVT prophylaxis  INFECTIOUS A:  No active issues P:    ENDOCRINE A:  No active issues P:     NEUROLOGIC A:  Intracranial hemorrhage with cerebral edema and midline shift  P:  q1h neurochecks.  RASS goal: 0 Started on 3% gtt, goal Na 150-155, q6h Na.  Repeat Head CT ordered for this am > not clear we need to do this.  Very poor prognosis.    FAMILY  - Updates: care discussed by Dr Sharen Hones with spouse and other relatives at bedside. They understand the severity of her illness and I have reviewed the scans with them. Her husband states that he would not wish to pursue aggressive management including CPR, electrical shocks, or medications should her condition deteriorate. She has been made DNR.   - Dr Delton Coombes spoke with pt's husband, sister at bedside, explained poor prognosis and the option of withdrawal of care. He understands and would not want her to be supported this way in absence of chance to recover.   - Inter-disciplinary family meet or Palliative Care meeting due 2/20   Independent CC time 35 minutes.    Levy Pupa, MD, PhD 2015/05/03, 9:00 AM [place] [phone] or if no answer [phone]

## 2015-04-18 NOTE — Procedures (Signed)
Extubation Procedure Note  Patient Details:   Name: Monica Wolf DOB: [birth year] MRN: [id number]   Airway Documentation:   pt extubated per withdrawal of life support protocol.  RN at bedside t/o.  Family at bedside and all questions answered.  Pt appeared comfortable t/o process.  Evaluation  O2 sats: stable throughout Complications: No apparent complications Patient did tolerate procedure well. Bilateral Breath Sounds: Clear, Diminished Suctioning: Oral, Airway No, withdrawal of life support protocol.  Jennette Kettle 04/10/15, 3:02 PM

## 2015-04-18 NOTE — Progress Notes (Signed)
Patient ID: Monica Wolf, female   DOB: 02/25/1935, 80 y.o.   MRN: [id number] In coma on ventilator Pupils are 4 mm very sluggishly reactive Extensor posturing Decision has been made to ultimately divide only comfort care I will sign off at this time

## 2015-04-18 NOTE — Progress Notes (Signed)
PT Cancellation and Discharge Note  Patient Details Name: Monica Wolf MRN: [id number] DOB: August 26, 1935   Cancelled Treatment:    Reason Eval/Treat Not Completed: PT screened, no needs identified, will sign off.  Noted plan is now for withdrawal of care.  Will sign off PT.     Jacobey Gura, Alison Murray 04-19-15, 11:27 AM

## 2015-04-18 NOTE — Progress Notes (Signed)
PT Cancellation Note  Patient Details Name: Monica Wolf MRN: [id number] DOB: 10/16/1935   Cancelled Treatment:    Reason Eval/Treat Not Completed: Patient not medically ready.  Pt currently on bedrest.  Please advance activity order once appropriate for PT and mobility.     Sunny Schlein, [place] [phone] 03/21/2015, 8:13 AM

## 2015-04-18 DEATH — deceased

## 2015-05-19 NOTE — Discharge Summary (Signed)
Patient ID: Monica EddyCharleen H Downum MRN: [id number] DOB/AGE: Feb 24, 1935 80 y.o.  Admit date: 04/10/2015 Death date: 2016-02-02  Admission Diagnoses: Intracerebral hemorrhage  Cause of Death:  Respiratory failure due to brain herniation due to large intracerebral hemorrhage related to warfarin coagulopathy and hypertensive emergency. Patient made DO NOT RESUSCITATE and comfort care by family  Pertinent Medical Diagnosis: Active Problems:   ICH (intracerebral hemorrhage) (HCC)   Cytotoxic brain edema (HCC)   Brain herniation (HCC)   Coma [facility](HCC)   Hospital Course:  Monica Wolf was a 80 y.o. female who was at church when she had sudden onset difficulty moving her left side. EMS was called and brought her in as a code stroke. In the ER, she was agitated, not following commands, purposeful with her right side but not moving her left. She had decreased mental status and after the CT revealed ICH, she was taken to the trauma bay and intubated due to decreased responsiveness.  She was on Coumadin and had a supratherapeutic INR and therefore received K centra As well as vitamin K.  LKW: 6 PM tpa given?: noICH ICH Score: 3   CT scan of the head on admission showed right external capsule and insular cortex intracerebral hemorrhage with small right to left shift however repeat CT scan showed a large 6.7 x 4.5 cm intraparenchymal hemorrhage with surrounding edema. Subsequent neurological worsening related to repeat scan which showed increase in hematoma with worsening 1.4 cm leftward midline shift which was only 0.4 cm on the initial admission CT scan. There was associated hydrocephalus and subfalcine herniation and mild transtentorial herniation. The patient neurological exam is quite poor and chances of survival with meaningful quality of life are negligible. After discussion with patient's family they understood the poor prognosis and a shunt did not want prolonged life support and a life of major  disability hence made the patient DO NOT RESUSCITATE and comfort care and ventilatory support was withdrawn. Patient was terminally weaned and passed away peacefully.  Signed: SETHI,PRAMOD 04/20/2015, 10:24 AM
